# Patient Record
Sex: Male | Born: 1980 | Race: Black or African American | Hispanic: No | Marital: Single | Smoking: Current every day smoker
Health system: Southern US, Community
[De-identification: ages and names within clinical notes are randomized; demographics above are authoritative.]

## PROBLEM LIST (undated history)

## (undated) DIAGNOSIS — J45909 Unspecified asthma, uncomplicated: Secondary | ICD-10-CM

## (undated) HISTORY — PX: SHOULDER ARTHROSCOPY: SHX128

---

## 2012-07-10 HISTORY — PX: SHOULDER SURGERY: SHX246

## 2013-04-11 ENCOUNTER — Emergency Department (HOSPITAL_COMMUNITY)
Admission: EM | Admit: 2013-04-11 | Discharge: 2013-04-12 | Disposition: A | Discharge status: Home / Self Care | Payer: Self-pay | Attending: Emergency Medicine | Admitting: Emergency Medicine

## 2013-04-11 ENCOUNTER — Encounter (HOSPITAL_COMMUNITY): Payer: Self-pay | Admitting: Emergency Medicine

## 2013-04-11 DIAGNOSIS — R1110 Vomiting, unspecified: Secondary | ICD-10-CM | POA: Insufficient documentation

## 2013-04-11 DIAGNOSIS — H53149 Visual discomfort, unspecified: Secondary | ICD-10-CM | POA: Insufficient documentation

## 2013-04-11 DIAGNOSIS — H538 Other visual disturbances: Secondary | ICD-10-CM | POA: Insufficient documentation

## 2013-04-11 DIAGNOSIS — Z79899 Other long term (current) drug therapy: Secondary | ICD-10-CM | POA: Insufficient documentation

## 2013-04-11 DIAGNOSIS — R51 Headache: Secondary | ICD-10-CM | POA: Insufficient documentation

## 2013-04-11 DIAGNOSIS — J45909 Unspecified asthma, uncomplicated: Secondary | ICD-10-CM | POA: Insufficient documentation

## 2013-04-11 DIAGNOSIS — F17200 Nicotine dependence, unspecified, uncomplicated: Secondary | ICD-10-CM | POA: Insufficient documentation

## 2013-04-11 HISTORY — DX: Unspecified asthma, uncomplicated: J45.909

## 2013-04-11 MED ORDER — DIPHENHYDRAMINE HCL 50 MG/ML IJ SOLN
25.0000 mg | Freq: Once | INTRAMUSCULAR | Status: AC
  Administered 2013-04-12: 25 mg via INTRAMUSCULAR
  Filled 2013-04-11 (×2): qty 1

## 2013-04-11 MED ORDER — METOCLOPRAMIDE HCL 5 MG/ML IJ SOLN
10.0000 mg | Freq: Once | INTRAMUSCULAR | Status: AC
  Administered 2013-04-12: 10 mg via INTRAMUSCULAR
  Filled 2013-04-11 (×2): qty 2

## 2013-04-11 NOTE — ED Notes (Signed)
PT. REPORTS RIGHT SIDE HEADACHE FOR 2 WEEKS AND VOMITTED YESTERDAY .

## 2013-04-11 NOTE — ED Provider Notes (Signed)
History    This chart was scribed for non-physician practitioner working with Joya Gaskins, MD by Donne Anon, ED Scribe. This patient was seen in room TR10C/TR10C and the patient's care was started at 2327.   CSN: [id number]  Arrival date & time 04/11/13  2150   First MD Initiated Contact with Patient 04/11/13 2327      Chief Complaint  Patient presents with  . Headache     The history is provided by the patient. No language interpreter was used.   Travis Soto is a 32 y.o. male who presents to the Emergency Department complaining of gradual onset, moderate, intermittent, gradually worsening right sided HA which is localized in the back of his head and began 2 weeks ago with episodes lasting several hours. He reports photophobia and sensitivity to loud sounds. He vomited yesterday. He states the HA are worse in afternoon once he wakes up (works overnight). He reports associated blurred vision and mild balance change (today only). He has tried Tylenol with mild relief. He states he has never had a HA like this before. He denies any recent head trauma.  He does not have a PCP.  Past Medical History  Diagnosis Date  . Asthma     History reviewed. No pertinent past surgical history.  No family history on file.  History  Substance Use Topics  . Smoking status: Current Every Day Smoker  . Smokeless tobacco: Not on file  . Alcohol Use: No      Review of Systems  Constitutional: Negative for fever.  HENT: Negative for congestion, rhinorrhea, neck pain, neck stiffness, dental problem and sinus pressure.   Eyes: Positive for photophobia and visual disturbance. Negative for discharge and redness.  Respiratory: Negative for shortness of breath.   Cardiovascular: Negative for chest pain.  Gastrointestinal: Positive for vomiting. Negative for nausea.  Musculoskeletal: Negative for gait problem.  Skin: Negative for rash.  Neurological: Positive for headaches. Negative for  syncope, speech difficulty, weakness, light-headedness and numbness.  Psychiatric/Behavioral: Negative for confusion.    Allergies  Coffee flavor; Mushroom extract complex; Tomato; and Food  Home Medications   Current Outpatient Rx  Name  Route  Sig  Dispense  Refill  . acetaminophen (TYLENOL) 500 MG tablet   Oral   Take 1,000 mg by mouth 3 (three) times daily as needed for pain.         Marland Kitchen albuterol (PROVENTIL HFA;VENTOLIN HFA) 108 (90 BASE) MCG/ACT inhaler   Inhalation   Inhale 2 puffs into the lungs 2 (two) times daily as needed for wheezing or shortness of breath.           Triage Vitals; BP 116/85  Pulse 66  Temp(Src) 98.9 F (37.2 C) (Oral)  Resp 14  SpO2 98%  Physical Exam  Nursing note and vitals reviewed. Constitutional: He is oriented to person, place, and time. He appears well-developed and well-nourished. No distress.  HENT:  Head: Normocephalic and atraumatic.  Right Ear: Tympanic membrane, external ear and ear canal normal.  Left Ear: Tympanic membrane, external ear and ear canal normal.  Nose: Nose normal.  Mouth/Throat: Uvula is midline, oropharynx is clear and moist and mucous membranes are normal.  Eyes: Conjunctivae, EOM and lids are normal. Pupils are equal, round, and reactive to light.  Neck: Normal range of motion. Neck supple. No tracheal deviation present.  No meningismus.   Cardiovascular: Normal rate and regular rhythm.   Pulmonary/Chest: Effort normal and breath sounds normal. No respiratory distress.  Abdominal: Soft. There is no tenderness.  Musculoskeletal: Normal range of motion.       Cervical back: He exhibits normal range of motion, no tenderness and no bony tenderness.  Neurological: He is alert and oriented to person, place, and time. He has normal strength and normal reflexes. No cranial nerve deficit or sensory deficit. He exhibits normal muscle tone. He displays a negative Romberg sign. Coordination and gait normal. GCS eye  subscore is 4. GCS verbal subscore is 5. GCS motor subscore is 6.  Skin: Skin is warm and dry.  Psychiatric: He has a normal mood and affect. His behavior is normal.    ED Course  Procedures (including critical care time) DIAGNOSTIC STUDIES: Oxygen Saturation is 98% on room air, normal by my interpretation.    COORDINATION OF CARE: 11:49 PM Discussed treatment plan which includes head CT and medication with pt at bedside and pt agreed to plan.     Labs Reviewed - No data to display Ct Head Wo Contrast  04/12/2013  *RADIOLOGY REPORT*  Clinical Data: Right-sided headache  CT HEAD WITHOUT CONTRAST  Technique:  Contiguous axial images were obtained from the base of the skull through the vertex without contrast.  Comparison: None.  Findings: There is no evidence for acute hemorrhage, hydrocephalus, mass lesion, or abnormal extra-axial fluid collection.  No definite CT evidence for acute infarction.  The visualized paranasal sinuses and mastoid air cells are predominately clear.  IMPRESSION: No acute intracranial abnormality.   Original Report Authenticated By: Jearld Lesch, M.D.      1. Headache     12:14 AM Patient seen and examined. Medications ordered. CT given atypical HA, lack of follow-up.   Vital signs reviewed and are as follows: Filed Vitals:   04/11/13 2159  BP: 116/85  Pulse: 66  Temp: 98.9 F (37.2 C)  Resp: 14   CT negative. Pt informed. Pain 6/10. He initially refused benadryl/reglan because he thought this was for nausea only. After explaining it was a treatment for HA pain. He consented and was given this medication prior to d/c. He has a ride home.   Urged PCP/neurology f/u. Patient urged to return with worsening symptoms or other concerns. Patient verbalized understanding and agrees with plan.     MDM  Pt with HA. CT performed as patient does not typically have HA, has new type of HA he has never had before, has HA that is everyday, has no reliable  follow-up. CT does not show any specific cause including mass or bleeding. This does not sound like recurrent sentinel bleeding and I feel SAH is very low. PCP/neurology f/u given with return if worsening.  Patient appears well, non-toxic. He has normal neurological exam.    I personally performed the services described in this documentation, which was scribed in my presence. The recorded information has been reviewed and is accurate.         Renne Crigler, PA-C 04/13/13 1321

## 2013-04-12 ENCOUNTER — Emergency Department (HOSPITAL_COMMUNITY): Payer: Self-pay

## 2013-04-12 ENCOUNTER — Encounter (HOSPITAL_COMMUNITY): Payer: Self-pay | Admitting: Radiology

## 2013-04-12 MED ORDER — IBUPROFEN 400 MG PO TABS
600.0000 mg | ORAL_TABLET | Freq: Once | ORAL | Status: AC
  Administered 2013-04-12: 600 mg via ORAL
  Filled 2013-04-12: qty 1

## 2013-04-12 NOTE — ED Notes (Signed)
Pt discharged.Vital signs stable and GCS 15 

## 2013-04-14 NOTE — ED Provider Notes (Signed)
Medical screening examination/treatment/procedure(s) were performed by non-physician practitioner and as supervising physician I was immediately available for consultation/collaboration.   Joya Gaskins, MD 04/14/13 [phone]

## 2013-05-18 ENCOUNTER — Encounter (HOSPITAL_COMMUNITY): Payer: Self-pay | Admitting: Emergency Medicine

## 2013-05-18 DIAGNOSIS — F17200 Nicotine dependence, unspecified, uncomplicated: Secondary | ICD-10-CM | POA: Insufficient documentation

## 2013-05-18 DIAGNOSIS — Z79899 Other long term (current) drug therapy: Secondary | ICD-10-CM | POA: Insufficient documentation

## 2013-05-18 DIAGNOSIS — J45909 Unspecified asthma, uncomplicated: Secondary | ICD-10-CM | POA: Insufficient documentation

## 2013-05-18 DIAGNOSIS — M7500 Adhesive capsulitis of unspecified shoulder: Secondary | ICD-10-CM | POA: Insufficient documentation

## 2013-05-18 DIAGNOSIS — M25519 Pain in unspecified shoulder: Secondary | ICD-10-CM | POA: Insufficient documentation
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
## 2013-05-19 ENCOUNTER — Encounter (HOSPITAL_COMMUNITY): Payer: Self-pay

## 2013-05-19 ENCOUNTER — Emergency Department (HOSPITAL_COMMUNITY): Payer: Self-pay

## 2013-05-19 ENCOUNTER — Emergency Department (HOSPITAL_COMMUNITY)
Admission: EM | Admit: 2013-05-19 | Discharge: 2013-05-19 | Disposition: A | Discharge status: Home / Self Care | Payer: Self-pay | Attending: Emergency Medicine | Admitting: Emergency Medicine

## 2013-05-19 DIAGNOSIS — M7502 Adhesive capsulitis of left shoulder: Secondary | ICD-10-CM

## 2013-05-19 DIAGNOSIS — M25512 Pain in left shoulder: Secondary | ICD-10-CM

## 2013-05-19 MED ORDER — CYCLOBENZAPRINE HCL 10 MG PO TABS: 5.0000 mg | ORAL_TABLET | Freq: Three times a day (TID) | ORAL | Status: DC | PRN

## 2013-05-19 MED ORDER — HYDROCODONE-ACETAMINOPHEN 5-325 MG PO TABS: 1.0000 | ORAL_TABLET | Freq: Four times a day (QID) | ORAL | Status: DC | PRN

## 2013-05-19 MED ORDER — HYDROCODONE-ACETAMINOPHEN 5-325 MG PO TABS
1.0000 | ORAL_TABLET | Freq: Once | ORAL | Status: AC
  Administered 2013-05-19: 1 via ORAL
  Filled 2013-05-19: qty 1

## 2013-05-19 MED ORDER — IBUPROFEN 800 MG PO TABS
800.0000 mg | ORAL_TABLET | Freq: Once | ORAL | Status: AC
  Administered 2013-05-19: 800 mg via ORAL
  Filled 2013-05-19: qty 1

## 2013-05-19 NOTE — ED Notes (Signed)
Pt back from xray and resting

## 2013-05-19 NOTE — ED Provider Notes (Deleted)
History     CSN: [id number]  Arrival date & time 05/18/13  2226   First MD Initiated Contact with Patient 05/19/13 0215      Chief Complaint  Patient presents with  . Shoulder Pain    (Consider location/radiation/quality/duration/timing/severity/associated sxs/prior treatment) Patient is a 32 y.o. male presenting with shoulder pain.  Shoulder Pain    Past Medical History  Diagnosis Date  . Asthma     Past Surgical History  Procedure Laterality Date  . Shoulder surgery Left 07/10/2012    History reviewed. No pertinent family history.  History  Substance Use Topics  . Smoking status: Current Every Day Smoker    Types: Cigarettes  . Smokeless tobacco: Not on file  . Alcohol Use: No      Review of Systems  Allergies  Coffee flavor; Mushroom extract complex; Tomato; and Food  Home Medications   Current Outpatient Rx  Name  Route  Sig  Dispense  Refill  . acetaminophen (TYLENOL) 500 MG tablet   Oral   Take 1,000 mg by mouth 3 (three) times daily as needed for pain.         Marland Kitchen albuterol (PROVENTIL HFA;VENTOLIN HFA) 108 (90 BASE) MCG/ACT inhaler   Inhalation   Inhale 2 puffs into the lungs 2 (two) times daily as needed for wheezing or shortness of breath.         . cyclobenzaprine (FLEXERIL) 10 MG tablet   Oral   Take 0.5 tablets (5 mg total) by mouth 3 (three) times daily as needed for muscle spasms.   30 tablet   0   . HYDROcodone-acetaminophen (NORCO/VICODIN) 5-325 MG per tablet   Oral   Take 1 tablet by mouth every 6 (six) hours as needed for pain.   30 tablet   0     BP 107/71  Pulse 60  Temp(Src) 97.7 F (36.5 C) (Oral)  Resp 18  SpO2 98%  Physical Exam  ED Course  Procedures (including critical care time)  Labs Reviewed - No data to display Dg Shoulder Left  05/19/2013   *RADIOLOGY REPORT*  Clinical Data: Left shoulder pain and numbness  LEFT SHOULDER - 2+ VIEW  Comparison: None.  Findings: Glenohumeral and acromioclavicular  joints are intact.  No displaced fracture. No aggressive osseous lesions.  Left lung clear.  IMPRESSION: No acute osseous finding left shoulder.   Original Report Authenticated By: Jearld Lesch, M.D.     1. Shoulder pain, left   2. Adhesive capsulitis of left shoulder       MDM   Plan was reviewed with the patient.  There is no fracture, and he, has adhesive capsulitis of his left shoulder.  Due to previous surgery        Arman Filter, NP 05/19/13 [phone]

## 2013-05-19 NOTE — ED Notes (Signed)
Patient transported to X-ray 

## 2013-05-19 NOTE — ED Provider Notes (Signed)
Medical screening examination/treatment/procedure(s) were performed by non-physician practitioner and as supervising physician I was immediately available for consultation/collaboration.  Sunnie Nielsen, MD 05/19/13 [phone]
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
## 2013-06-05 NOTE — ED Provider Notes (Signed)
History     CSN: [id number]  Arrival date & time 05/18/13  2226   First MD Initiated Contact with Patient 05/19/13 0215      Chief Complaint  Patient presents with  . Shoulder Pain    (Consider location/radiation/quality/duration/timing/severity/associated sxs/prior treatment) HPI Comments: Recurrent L shoulder pain for the past week No meds PTA  Patient is a 32 y.o. male presenting with shoulder pain. The history is provided by the patient.  Shoulder Pain This is a recurrent problem. The current episode started in the past 7 days. The problem has been gradually worsening. Associated symptoms include joint swelling. Pertinent negatives include no numbness or weakness. The symptoms are aggravated by exertion. He has tried nothing for the symptoms. The treatment provided no relief.    Past Medical History  Diagnosis Date  . Asthma     Past Surgical History  Procedure Laterality Date  . Shoulder surgery Left 07/10/2012    History reviewed. No pertinent family history.  History  Substance Use Topics  . Smoking status: Current Every Day Smoker    Types: Cigarettes  . Smokeless tobacco: Not on file  . Alcohol Use: No      Review of Systems  Unable to perform ROS Musculoskeletal: Positive for joint swelling.  Neurological: Negative for weakness and numbness.  All other systems reviewed and are negative.    Allergies  Coffee flavor; Mushroom extract complex; Tomato; and Food  Home Medications   Current Outpatient Rx  Name  Route  Sig  Dispense  Refill  . acetaminophen (TYLENOL) 500 MG tablet   Oral   Take 1,000 mg by mouth 3 (three) times daily as needed for pain.         Marland Kitchen albuterol (PROVENTIL HFA;VENTOLIN HFA) 108 (90 BASE) MCG/ACT inhaler   Inhalation   Inhale 2 puffs into the lungs 2 (two) times daily as needed for wheezing or shortness of breath.         . cyclobenzaprine (FLEXERIL) 10 MG tablet   Oral   Take 0.5 tablets (5 mg total) by mouth 3  (three) times daily as needed for muscle spasms.   30 tablet   0   . HYDROcodone-acetaminophen (NORCO/VICODIN) 5-325 MG per tablet   Oral   Take 1 tablet by mouth every 6 (six) hours as needed for pain.   30 tablet   0     BP 107/71  Pulse 60  Temp(Src) 97.7 F (36.5 C) (Oral)  Resp 18  SpO2 98%  Physical Exam  Nursing note and vitals reviewed. Constitutional: He is oriented to person, place, and time. He appears well-developed and well-nourished.  HENT:  Head: Normocephalic.  Eyes: Pupils are equal, round, and reactive to light.  Neck: Normal range of motion.  Cardiovascular: Normal rate and regular rhythm.   Pulmonary/Chest: Effort normal and breath sounds normal.  Musculoskeletal: He exhibits tenderness. He exhibits no edema.       Left shoulder: He exhibits decreased range of motion and pain. He exhibits no swelling, no effusion, no crepitus, no deformity and no laceration.  Neurological: He is alert and oriented to person, place, and time.  Skin: Skin is warm and dry.    ED Course  Procedures (including critical care time)  Labs Reviewed - No data to display No results found.   1. Shoulder pain, left   2. Adhesive capsulitis of left shoulder       MDM  Arman Filter, NP 06/05/13 2050

## 2013-06-06 NOTE — ED Provider Notes (Signed)
Medical screening examination/treatment/procedure(s) were performed by non-physician practitioner and as supervising physician I was immediately available for consultation/collaboration.  Juliet Rude. Rubin Payor, MD 06/06/13 2308

## 2013-07-31 ENCOUNTER — Emergency Department (HOSPITAL_COMMUNITY): Payer: Self-pay

## 2013-07-31 ENCOUNTER — Emergency Department (HOSPITAL_COMMUNITY)
Admission: EM | Admit: 2013-07-31 | Discharge: 2013-08-01 | Disposition: A | Discharge status: Home / Self Care | Payer: Self-pay | Attending: Emergency Medicine | Admitting: Emergency Medicine

## 2013-07-31 ENCOUNTER — Encounter (HOSPITAL_COMMUNITY): Payer: Self-pay | Admitting: *Deleted

## 2013-07-31 DIAGNOSIS — J45909 Unspecified asthma, uncomplicated: Secondary | ICD-10-CM | POA: Insufficient documentation

## 2013-07-31 DIAGNOSIS — F17200 Nicotine dependence, unspecified, uncomplicated: Secondary | ICD-10-CM | POA: Insufficient documentation

## 2013-07-31 DIAGNOSIS — R071 Chest pain on breathing: Secondary | ICD-10-CM | POA: Insufficient documentation

## 2013-07-31 DIAGNOSIS — R0789 Other chest pain: Secondary | ICD-10-CM

## 2013-07-31 DIAGNOSIS — Z79899 Other long term (current) drug therapy: Secondary | ICD-10-CM | POA: Insufficient documentation

## 2013-07-31 MED ORDER — IBUPROFEN 800 MG PO TABS
800.0000 mg | ORAL_TABLET | Freq: Once | ORAL | Status: AC
  Administered 2013-07-31: 800 mg via ORAL
  Filled 2013-07-31: qty 1

## 2013-07-31 MED ORDER — GI COCKTAIL ~~LOC~~
30.0000 mL | Freq: Once | ORAL | Status: AC
  Administered 2013-07-31: 30 mL via ORAL
  Filled 2013-07-31: qty 30

## 2013-07-31 MED ORDER — HYDROCODONE-ACETAMINOPHEN 5-325 MG PO TABS
2.0000 | ORAL_TABLET | Freq: Once | ORAL | Status: AC
  Administered 2013-07-31: 2 via ORAL
  Filled 2013-07-31: qty 2

## 2013-07-31 NOTE — ED Notes (Signed)
NURSE FIRST ROUNDS: NURSE EXPLAINED DELAY , WAIT TIME AND PROCESS TO PT.

## 2013-07-31 NOTE — ED Notes (Signed)
The pt is c/o mid-chest pain since Saturday.  No sob n v and his pain is worse when he swallows.  The pain radiates through posteriorly

## 2013-08-01 MED ORDER — HYDROCODONE-ACETAMINOPHEN 5-325 MG PO TABS: 2.0000 | ORAL_TABLET | Freq: Four times a day (QID) | ORAL | Status: DC | PRN

## 2013-08-01 MED ORDER — IBUPROFEN 800 MG PO TABS: 800.0000 mg | ORAL_TABLET | Freq: Three times a day (TID) | ORAL | Status: DC | PRN

## 2013-08-01 NOTE — ED Provider Notes (Signed)
CSN: [id number]     Arrival date & time 07/31/13  1759 History     First MD Initiated Contact with Patient 07/31/13 2259     Chief Complaint  Patient presents with  . Chest Pain   (Consider location/radiation/quality/duration/timing/severity/associated sxs/prior Treatment) HPI 32 yo male with chest pain for the last 5 days that has not been improving.  Pain is in the center of the chest and in the center of the back.  No sob, no n/v.  No abd pain.  Pain is worse with movement and palpation. Pt also reports some pain with swallowing.  No sensation of food getting hung up, no reflux symptoms.  Pt reports he was moving heavy boxes at the end of last week, but did not have pain at the time.  He has not taken anything for the pain.  No significant family history.   Past Medical History  Diagnosis Date  . Asthma    Past Surgical History  Procedure Laterality Date  . Shoulder surgery Left 07/10/2012   No family history on file. History  Substance Use Topics  . Smoking status: Current Every Day Smoker    Types: Cigarettes  . Smokeless tobacco: Not on file  . Alcohol Use: No    Review of Systems  All other systems reviewed and are negative.    Allergies  Coffee flavor; Mushroom extract complex; Tomato; and Food  Home Medications   Current Outpatient Rx  Name  Route  Sig  Dispense  Refill  . acetaminophen (TYLENOL) 500 MG tablet   Oral   Take 1,000 mg by mouth 3 (three) times daily as needed for pain.         . Chlorphen-Phenyleph-ASA (ALKA-SELTZER PLUS COLD PO)   Oral   Take 2 tablets by mouth daily as needed (cold).         . cyclobenzaprine (FLEXERIL) 10 MG tablet   Oral   Take 0.5 tablets (5 mg total) by mouth 3 (three) times daily as needed for muscle spasms.   30 tablet   0   . OVER THE COUNTER MEDICATION   Oral   Take 1 tablet by mouth daily as needed (congestion).         Marland Kitchen albuterol (PROVENTIL HFA;VENTOLIN HFA) 108 (90 BASE) MCG/ACT inhaler  Inhalation   Inhale 2 puffs into the lungs 2 (two) times daily as needed for wheezing or shortness of breath.         Marland Kitchen HYDROcodone-acetaminophen (NORCO/VICODIN) 5-325 MG per tablet   Oral   Take 2 tablets by mouth every 6 (six) hours as needed for pain.   30 tablet   0   . ibuprofen (ADVIL,MOTRIN) 800 MG tablet   Oral   Take 1 tablet (800 mg total) by mouth every 8 (eight) hours as needed for pain.   30 tablet   0    BP 124/75  Pulse 57  Temp(Src) 98.7 F (37.1 C) (Oral)  Resp 14  SpO2 100% Physical Exam  Nursing note and vitals reviewed. Constitutional: He is oriented to person, place, and time. He appears well-developed and well-nourished.  HENT:  Head: Normocephalic and atraumatic.  Nose: Nose normal.  Mouth/Throat: Oropharynx is clear and moist.  Eyes: Conjunctivae and EOM are normal. Pupils are equal, round, and reactive to light.  Neck: Normal range of motion. Neck supple. No JVD present. No tracheal deviation present. No thyromegaly present.  Cardiovascular: Normal rate, regular rhythm, normal heart sounds and intact distal pulses.  Exam reveals no gallop and no friction rub.   No murmur heard. Pulmonary/Chest: Effort normal and breath sounds normal. No stridor. No respiratory distress. He has no wheezes. He has no rales. He exhibits tenderness (ttp over sternum and over posterior chest wall).  Palpation of chest wall reproduces symptoms  Abdominal: Soft. Bowel sounds are normal. He exhibits no distension and no mass. There is no tenderness. There is no rebound and no guarding.  Musculoskeletal: Normal range of motion. He exhibits no edema and no tenderness.  Lymphadenopathy:    He has no cervical adenopathy.  Neurological: He is alert and oriented to person, place, and time. He exhibits normal muscle tone. Coordination normal.  Skin: Skin is warm and dry. No rash noted. No erythema. No pallor.  Psychiatric: He has a normal mood and affect. His behavior is normal.  Judgment and thought content normal.    ED Course   Procedures (including critical care time)  Labs Reviewed - No data to display Dg Chest 2 View  07/31/2013   *RADIOLOGY REPORT*  Clinical Data: Chest pain, smoker  CHEST - 2 VIEW  Comparison:  None.  Findings:  The heart size and mediastinal contours are within normal limits.  Both lungs are clear.  The visualized skeletal structures are unremarkable.  IMPRESSION: No active cardiopulmonary disease.   Original Report Authenticated By: Christiana Pellant, M.D.    Date: 08/01/2013  Rate: 71  Rhythm: normal sinus rhythm  QRS Axis: normal  Intervals: normal  ST/T Wave abnormalities: nonspecific ST/T changes  Conduction Disutrbances:none  Narrative Interpretation:   Old EKG Reviewed: none available    1. Chest wall pain     MDM  31 yo male with anterior and posterior chest pain.  Xray normal.  Sxs constant since Saturday, do not seem cardiac in nature, doubt dissection/aneurysm of aorta.  Reproducible pain on exam.  The dysphagia does not fit in with rest of picture, but no choking or gagging.  Pt feeling better after medications.  Will refer to pcm, continue pain medications.  Given precautions for return.  Olivia Mackie, MD 08/01/13 [phone]

## 2013-09-20 ENCOUNTER — Emergency Department (HOSPITAL_COMMUNITY)
Admission: EM | Admit: 2013-09-20 | Discharge: 2013-09-20 | Disposition: A | Discharge status: Home / Self Care | Payer: Self-pay | Attending: Emergency Medicine | Admitting: Emergency Medicine

## 2013-09-20 ENCOUNTER — Encounter (HOSPITAL_COMMUNITY): Payer: Self-pay | Admitting: *Deleted

## 2013-09-20 ENCOUNTER — Emergency Department (HOSPITAL_COMMUNITY): Payer: Self-pay

## 2013-09-20 DIAGNOSIS — Z87891 Personal history of nicotine dependence: Secondary | ICD-10-CM | POA: Insufficient documentation

## 2013-09-20 DIAGNOSIS — W108XXA Fall (on) (from) other stairs and steps, initial encounter: Secondary | ICD-10-CM | POA: Insufficient documentation

## 2013-09-20 DIAGNOSIS — Y929 Unspecified place or not applicable: Secondary | ICD-10-CM | POA: Insufficient documentation

## 2013-09-20 DIAGNOSIS — R0789 Other chest pain: Secondary | ICD-10-CM

## 2013-09-20 DIAGNOSIS — S298XXA Other specified injuries of thorax, initial encounter: Secondary | ICD-10-CM | POA: Insufficient documentation

## 2013-09-20 DIAGNOSIS — Y9389 Activity, other specified: Secondary | ICD-10-CM | POA: Insufficient documentation

## 2013-09-20 DIAGNOSIS — Z79899 Other long term (current) drug therapy: Secondary | ICD-10-CM | POA: Insufficient documentation

## 2013-09-20 DIAGNOSIS — J45909 Unspecified asthma, uncomplicated: Secondary | ICD-10-CM | POA: Insufficient documentation

## 2013-09-20 MED ORDER — IBUPROFEN 800 MG PO TABS
800.0000 mg | ORAL_TABLET | Freq: Once | ORAL | Status: AC
  Administered 2013-09-20: 800 mg via ORAL
  Filled 2013-09-20: qty 1

## 2013-09-20 MED ORDER — OXYCODONE-ACETAMINOPHEN 5-325 MG PO TABS: 1.0000 | ORAL_TABLET | Freq: Four times a day (QID) | ORAL | Status: DC | PRN

## 2013-09-20 MED ORDER — IBUPROFEN 800 MG PO TABS: 800.0000 mg | ORAL_TABLET | Freq: Three times a day (TID) | ORAL | Status: DC

## 2013-09-20 NOTE — ED Notes (Addendum)
Pt. Fell down the steps while carrying a TV; fell backwards and hurt his left side. Pt. Has abrasions across the right upper ant. Chest. No open wounds. Left arm hurts -  Difficult to lift arm.  Hx. Of left shoulder surgery.

## 2013-09-20 NOTE — ED Provider Notes (Signed)
Medical screening examination/treatment/procedure(s) were performed by non-physician practitioner and as supervising physician I was immediately available for consultation/collaboration.  Olivia Mackie, MD 09/20/13 [phone]

## 2013-09-20 NOTE — ED Provider Notes (Signed)
CSN: [id number]     Arrival date & time 09/20/13  0303 History   First MD Initiated Contact with Patient 09/20/13 0309     Chief Complaint  Patient presents with  . Fall   (Consider location/radiation/quality/duration/timing/severity/associated sxs/prior Treatment) HPI Comments: Patient is a 32 year old male who presents for chest wall pain. Pain began after patient was carrying a TV and fell backwards down the stairs causing a TV to fall on top of him. Patient states that symptoms have been constant since onset a few hours ago. He states the pain is worse with deep breathing. He denies any alleviating factors of his pain and has not taken anything over-the-counter prior to arrival. Patient denies hitting his head or loss of consciousness. He further denies fevers, near syncope, numbness or tingling, and weakness.  Patient is a 32 y.o. male presenting with fall. The history is provided by the patient. No language interpreter was used.  Fall Associated symptoms include chest pain and myalgias.    Past Medical History  Diagnosis Date  . Asthma    Past Surgical History  Procedure Laterality Date  . Shoulder surgery Left 07/10/2012   History reviewed. No pertinent family history. History  Substance Use Topics  . Smoking status: Former Smoker    Types: Cigarettes  . Smokeless tobacco: Not on file  . Alcohol Use: No    Review of Systems  Cardiovascular: Positive for chest pain.  Musculoskeletal: Positive for myalgias.  All other systems reviewed and are negative.    Allergies  Coffee flavor; Mushroom extract complex; Tomato; and Food  Home Medications   Current Outpatient Rx  Name  Route  Sig  Dispense  Refill  . albuterol (PROVENTIL HFA;VENTOLIN HFA) 108 (90 BASE) MCG/ACT inhaler   Inhalation   Inhale 2 puffs into the lungs every 6 (six) hours as needed for wheezing.         . cyclobenzaprine (FLEXERIL) 10 MG tablet   Oral   Take 0.5 tablets (5 mg total) by mouth 3  (three) times daily as needed for muscle spasms.   30 tablet   0   . ibuprofen (ADVIL,MOTRIN) 800 MG tablet   Oral   Take 1 tablet (800 mg total) by mouth 3 (three) times daily.   21 tablet   0   . oxyCODONE-acetaminophen (PERCOCET/ROXICET) 5-325 MG per tablet   Oral   Take 1 tablet by mouth every 6 (six) hours as needed for pain.   11 tablet   0    BP 146/84  Pulse 78  Temp(Src) 98.5 F (36.9 C) (Oral)  Resp 10  Ht 6' (1.829 m)  Wt 187 lb (84.823 kg)  BMI 25.36 kg/m2  SpO2 98% Physical Exam  Nursing note and vitals reviewed. Constitutional: He is oriented to person, place, and time. He appears well-developed and well-nourished. No distress.  HENT:  Head: Normocephalic and atraumatic.  Eyes: Conjunctivae and EOM are normal. No scleral icterus.  Neck: Normal range of motion. Neck supple.  Cardiovascular: Normal rate, regular rhythm, normal heart sounds and intact distal pulses.   Distal radial pulses 2+ bilaterally.  Pulmonary/Chest: Effort normal and breath sounds normal. No respiratory distress. He has no wheezes. He has no rales. He exhibits tenderness. He exhibits no bony tenderness, no crepitus, no deformity and no retraction.    Abrasion noted to right anterior chest wall. No respiratory distress. No retractions or accessory muscle use.  Abdominal: Soft. He exhibits no distension. There is no tenderness.  Musculoskeletal:  Normal range of motion.  No evidence of acute trauma to the back; no contusions, abrasions, lacerations, or ecchymosis. No tenderness to palpation of the cervical, thoracic, or lumbosacral midline. No bony deformities or step-offs palpated.  Neurological: He is alert and oriented to person, place, and time.  Skin: Skin is warm and dry. No rash noted. He is not diaphoretic. No erythema. No pallor.  Psychiatric: He has a normal mood and affect. His behavior is normal.    ED Course  Procedures (including critical care time) Labs Review Labs  Reviewed - No data to display Imaging Review Dg Chest 2 View  09/20/2013   CLINICAL DATA:  Fall, chest pain, shortness of Breath.  EXAM: CHEST  2 VIEW  COMPARISON:  07/31/2013  FINDINGS: The heart size and mediastinal contours are within normal limits. Both lungs are clear. The visualized skeletal structures are unremarkable. No visible rib fractures. No pneumothorax.  IMPRESSION: No active cardiopulmonary disease.   Electronically Signed   By: Charlett Nose M.D.   On: 09/20/2013 03:40    MDM   1. Chest wall pain    Patient presents for chest pain after falling while carrying a TV. Patient well and nontoxic appearing, hemodynamically stable, and afebrile. Patient moves his extremities without ataxia. He is in no respiratory distress with no accessory muscle use or retractions. Abrasions noted to right anterior chest. No other evidence of acute trauma appreciated. Chest x-ray without evidence of rib fractures by my interpretation. There is no pneumothorax. Lungs clear to auscultation bilaterally. Symptoms consistent with chest wall pain that is musculoskeletal in origin. Patient appropriate for discharge with primary care follow up. Ibuprofen and ice to the affected area denies her symptoms. Percocet prescribed for pain control. Return precautions discussed and patient agreeable to plan with no unaddressed concerns.    Antony Madura, PA-C 09/20/13 0422

## 2014-03-12 ENCOUNTER — Encounter (HOSPITAL_COMMUNITY): Payer: Self-pay | Admitting: Emergency Medicine

## 2014-03-12 DIAGNOSIS — L0231 Cutaneous abscess of buttock: Secondary | ICD-10-CM | POA: Insufficient documentation

## 2014-03-12 DIAGNOSIS — K047 Periapical abscess without sinus: Secondary | ICD-10-CM | POA: Insufficient documentation

## 2014-03-12 DIAGNOSIS — Z87891 Personal history of nicotine dependence: Secondary | ICD-10-CM | POA: Insufficient documentation

## 2014-03-12 DIAGNOSIS — J45909 Unspecified asthma, uncomplicated: Secondary | ICD-10-CM | POA: Insufficient documentation

## 2014-03-12 DIAGNOSIS — R1909 Other intra-abdominal and pelvic swelling, mass and lump: Secondary | ICD-10-CM | POA: Insufficient documentation

## 2014-03-12 DIAGNOSIS — L03317 Cellulitis of buttock: Secondary | ICD-10-CM

## 2014-03-12 NOTE — ED Notes (Signed)
Patient with left jaw/dental pain and swelling.  Patient states that he also has boils in his right inguinal area and between his buttock cheeks.  Patient states he is having a hard time sitting due to the pain.

## 2014-03-13 ENCOUNTER — Emergency Department (HOSPITAL_COMMUNITY)
Admission: EM | Admit: 2014-03-13 | Discharge: 2014-03-13 | Disposition: A | Discharge status: Home / Self Care | Payer: Self-pay | Attending: Emergency Medicine | Admitting: Emergency Medicine

## 2014-03-13 DIAGNOSIS — K047 Periapical abscess without sinus: Secondary | ICD-10-CM

## 2014-03-13 DIAGNOSIS — L0291 Cutaneous abscess, unspecified: Secondary | ICD-10-CM

## 2014-03-13 MED ORDER — OXYCODONE-ACETAMINOPHEN 5-325 MG PO TABS
1.0000 | ORAL_TABLET | Freq: Once | ORAL | Status: AC
  Administered 2014-03-13: 1 via ORAL
  Filled 2014-03-13: qty 1

## 2014-03-13 MED ORDER — CLINDAMYCIN HCL 150 MG PO CAPS
300.0000 mg | ORAL_CAPSULE | Freq: Once | ORAL | Status: AC
  Administered 2014-03-13: 300 mg via ORAL
  Filled 2014-03-13: qty 2

## 2014-03-13 MED ORDER — CLINDAMYCIN HCL 150 MG PO CAPS: 300.0000 mg | ORAL_CAPSULE | Freq: Three times a day (TID) | ORAL | Status: DC

## 2014-03-13 MED ORDER — OXYCODONE-ACETAMINOPHEN 5-325 MG PO TABS: 1.0000 | ORAL_TABLET | ORAL | Status: DC | PRN

## 2014-03-13 NOTE — Discharge Instructions (Signed)
Abscess An abscess is an infected area that contains a collection of pus and debris.It can occur in almost any part of the body. An abscess is also known as a furuncle or boil. CAUSES  An abscess occurs when tissue gets infected. This can occur from blockage of oil or sweat glands, infection of hair follicles, or a minor injury to the skin. As the body tries to fight the infection, pus collects in the area and creates pressure under the skin. This pressure causes pain. People with weakened immune systems have difficulty fighting infections and get certain abscesses more often.  SYMPTOMS Usually an abscess develops on the skin and becomes a painful mass that is red, warm, and tender. If the abscess forms under the skin, you may feel a moveable soft area under the skin. Some abscesses break open (rupture) on their own, but most will continue to get worse without care. The infection can spread deeper into the body and eventually into the bloodstream, causing you to feel ill.  DIAGNOSIS  Your caregiver will take your medical history and perform a physical exam. A sample of fluid may also be taken from the abscess to determine what is causing your infection. TREATMENT  Your caregiver may prescribe antibiotic medicines to fight the infection. However, taking antibiotics alone usually does not cure an abscess. Your caregiver may need to make a small cut (incision) in the abscess to drain the pus. In some cases, gauze is packed into the abscess to reduce pain and to continue draining the area. HOME CARE INSTRUCTIONS   Only take over-the-counter or prescription medicines for pain, discomfort, or fever as directed by your caregiver.  If you were prescribed antibiotics, take them as directed. Finish them even if you start to feel better.  If gauze is used, follow your caregiver's directions for changing the gauze.  To avoid spreading the infection:  Keep your draining abscess covered with a  bandage.  Wash your hands well.  Do not share personal care items, towels, or whirlpools with others.  Avoid skin contact with others.  Keep your skin and clothes clean around the abscess.  Keep all follow-up appointments as directed by your caregiver. SEEK MEDICAL CARE IF:   You have increased pain, swelling, redness, fluid drainage, or bleeding.  You have muscle aches, chills, or a general ill feeling.  You have a fever. MAKE SURE YOU:   Understand these instructions.  Will watch your condition.  Will get help right away if you are not doing well or get worse. Document Released: 09/14/2005 Document Revised: 06/05/2012 Document Reviewed: 02/17/2012 [facility] Patient Information 2014 [place], [place].  Abscessed Tooth An abscessed tooth is an infection around your tooth. It may be caused by holes or damage to the tooth (cavity) or a dental disease. An abscessed tooth causes mild to very bad pain in and around the tooth. See your dentist right away if you have tooth or gum pain. HOME CARE  Take your medicine as told. Finish it even if you start to feel better.  Do not drive after taking pain medicine.  Rinse your mouth (gargle) often with salt water ( teaspoon salt in 8 ounces of warm water).  Do not apply heat to the outside of your face. GET HELP RIGHT AWAY IF:   You have a temperature by mouth above 102 F (38.9 C), not controlled by medicine.  You have chills and a very bad headache.  You have problems breathing or swallowing.  Your mouth  will not open.  You develop puffiness (swelling) on the neck or around the eye.  Your pain is not helped by medicine.  Your pain is getting worse instead of better. MAKE SURE YOU:   Understand these instructions.  Will watch your condition.  Will get help right away if you are not doing well or get worse. Document Released: 05/23/2008 Document Revised: 02/27/2012 Document Reviewed: 03/15/2011 [facility] Patient  Information 2014 [place], [place].

## 2014-03-13 NOTE — ED Notes (Signed)
[place]

## 2014-03-13 NOTE — ED Provider Notes (Signed)
CSN: [id number]     Arrival date & time 03/12/14  1914 History   First MD Initiated Contact with Patient 03/13/14 0025     Chief Complaint  Patient presents with  . Dental Pain  . Recurrent Skin Infections     (Consider location/radiation/quality/duration/timing/severity/associated sxs/prior Treatment) Patient is a 33 y.o. male presenting with tooth pain and abscess. The history is provided by the patient. No language interpreter was used.  Dental Pain Location:  Lower Lower teeth location:  28/RL 1st bicuspid Quality:  Aching Severity:  Moderate Duration:  2 days Associated symptoms: facial swelling and fever   Abscess Location:  Ano-genital and torso Torso abscess location: Suprapubic area.  Ano-genital abscess location:  L buttock Abscess quality comment:  Abscess to left buttock has opened and drained a significant amount of fluids. The suprapubic abscess has not been draining.  Associated symptoms: fever   Associated symptoms: no nausea     Past Medical History  Diagnosis Date  . Asthma    Past Surgical History  Procedure Laterality Date  . Shoulder surgery Left 07/10/2012   No family history on file. History  Substance Use Topics  . Smoking status: Former Smoker    Types: Cigarettes  . Smokeless tobacco: Not on file  . Alcohol Use: No    Review of Systems  Constitutional: Positive for fever.  HENT: Positive for dental problem and facial swelling.   Gastrointestinal: Negative for nausea.  Skin:       See HPI.      Allergies  Coffee flavor; Mushroom extract complex; Tomato; and Food  Home Medications   Current Outpatient Rx  Name  Route  Sig  Dispense  Refill  . acetaminophen (TYLENOL) 500 MG tablet   Oral   Take 1,000 mg by mouth every 6 (six) hours as needed.         Marland Kitchen. aspirin-acetaminophen-caffeine (EXCEDRIN MIGRAINE) 250-250-65 MG per tablet   Oral   Take 2 tablets by mouth every 8 (eight) hours as needed (for tooth pain).            BP 111/76  Pulse 68  Temp(Src) 98.5 F (36.9 C) (Oral)  Resp 14  SpO2 100% Physical Exam  Constitutional: He is oriented to person, place, and time. He appears well-developed and well-nourished.  HENT:  Small amount of drainage inferior to #28 without significant swelling.   Neck: Normal range of motion.  Pulmonary/Chest: Effort normal.  Musculoskeletal: Normal range of motion.  Neurological: He is alert and oriented to person, place, and time.  Skin: Skin is warm and dry.  Tender area at gluteal cleft at inferior left buttock. Drainage present without induration. No redness. No tenderness near the anus. Suprapubic region: small nodular tender swelling without drainage or fluctuance.   Psychiatric: He has a normal mood and affect.    ED Course  Procedures (including critical care time) Labs Review Labs Reviewed - No data to display Imaging Review No results found.   EKG Interpretation None      MDM   Final diagnoses:  None    1. Cutaneous abscesses 2. Dental abscess  No drainable abscess (buttock already opened, suprapublic early w/o fluctuance). Will put the patient on Clindamycin for coverage of both skin abscess and dental abscess. Recheck in 2 days if no better.     Arnoldo HookerShari A Rori Goar, PA-C 03/13/14 [phone]

## 2014-03-16 NOTE — ED Provider Notes (Signed)
Medical screening examination/treatment/procedure(s) were performed by non-physician practitioner and as supervising physician I was immediately available for consultation/collaboration.   EKG Interpretation None        Julie Manly, MD 03/16/14 0819 

## 2015-02-18 ENCOUNTER — Encounter (HOSPITAL_COMMUNITY): Payer: Self-pay | Admitting: Emergency Medicine

## 2015-02-18 ENCOUNTER — Emergency Department (HOSPITAL_COMMUNITY)
Admission: EM | Admit: 2015-02-18 | Discharge: 2015-02-18 | Disposition: A | Discharge status: Home / Self Care | Payer: Self-pay | Attending: Emergency Medicine | Admitting: Emergency Medicine

## 2015-02-18 DIAGNOSIS — Z792 Long term (current) use of antibiotics: Secondary | ICD-10-CM | POA: Insufficient documentation

## 2015-02-18 DIAGNOSIS — L0231 Cutaneous abscess of buttock: Secondary | ICD-10-CM | POA: Insufficient documentation

## 2015-02-18 DIAGNOSIS — J45909 Unspecified asthma, uncomplicated: Secondary | ICD-10-CM | POA: Insufficient documentation

## 2015-02-18 MED ORDER — LIDOCAINE HCL (PF) 2 % IJ SOLN
0.0000 mL | Freq: Once | INTRAMUSCULAR | Status: AC | PRN
  Administered 2015-02-18: 10 mL via INTRADERMAL
  Filled 2015-02-18: qty 10

## 2015-02-18 MED ORDER — SULFAMETHOXAZOLE-TRIMETHOPRIM 800-160 MG PO TABS: 1.0000 | ORAL_TABLET | Freq: Two times a day (BID) | ORAL | Status: AC

## 2015-02-18 NOTE — ED Notes (Signed)
Patient states recurrent boil to L buttock.   Patient states didn't take anything for pain at home, but has been using warm compresses and taking hot baths to relieve pain.

## 2015-02-18 NOTE — ED Notes (Signed)
Assisted PA with draining abscess.

## 2015-02-18 NOTE — Discharge Instructions (Signed)
Return to the emergency room with worsening of symptoms, new symptoms or with symptoms that are concerning, especially fevers, increased ,redness, swelling, red streaks.  Please take all of your antibiotics until finished!   You may develop abdominal discomfort or diarrhea from the antibiotic.  You may help offset this with probiotics which you can buy or get in yogurt. Do not eat  or take the probiotics until 2 hours after your antibiotic.  Follow up with urgent care in 2 days for wound recheck. Read below information and follow recommendations.  Abscess Care After An abscess (also called a boil or furuncle) is an infected area that contains a collection of pus. Signs and symptoms of an abscess include pain, tenderness, redness, or hardness, or you may feel a moveable soft area under your skin. An abscess can occur anywhere in the body. The infection may spread to surrounding tissues causing cellulitis. A cut (incision) by the surgeon was made over your abscess and the pus was drained out. Gauze may have been packed into the space to provide a drain that will allow the cavity to heal from the inside outwards. The boil may be painful for 5 to 7 days. Most people with a boil do not have high fevers. Your abscess, if seen early, may not have localized, and may not have been lanced. If not, another appointment may be required for this if it does not get better on its own or with medications. HOME CARE INSTRUCTIONS   Only take over-the-counter or prescription medicines for pain, discomfort, or fever as directed by your caregiver.  When you bathe, soak and then remove gauze or iodoform packs at least daily or as directed by your caregiver. You may then wash the wound gently with mild soapy water. Repack with gauze or do as your caregiver directs. SEEK IMMEDIATE MEDICAL CARE IF:   You develop increased pain, swelling, redness, drainage, or bleeding in the wound site.  You develop signs of generalized  infection including muscle aches, chills, fever, or a general ill feeling.  An oral temperature above 102 F (38.9 C) develops, not controlled by medication. See your caregiver for a recheck if you develop any of the symptoms described above. If medications (antibiotics) were prescribed, take them as directed. Document Released: 06/23/2005 Document Revised: 02/27/2012 Document Reviewed: 02/18/2008 [facility] Patient Information 2015 [place], [place]. This information is not intended to replace advice given to you by your health care provider. Make sure you discuss any questions you have with your health care provider.

## 2015-02-18 NOTE — ED Provider Notes (Signed)
CSN: [id number]     Arrival date & time 02/18/15  0927 History  This chart was scribed for Travis Conroy, PA-C working with Vanetta Mulders, MD by Elveria Rising, ED Scribe. This patient was seen in room TR10C/TR10C and the patient's care was started at 10:07 AM.   Chief Complaint  Patient presents with  . Abscess   The history is provided by the patient. No language interpreter was used.   HPI Comments: Travis Soto is a 34 y.o. male who presents to the Emergency Department complaining of abscess to left lower medial buttocks for 2.5 days. Patient reports pain with sitting and walking. Patient reports treatment with warm compresses and hot baths, but denies improvement. Patient is uncertain of drainage.  Patient shares history of reoccurring abscess in the same location but states that this episode is the worst. Patient states that with treatment the abscess typically resolves within a few days, but this one has not. No history of DM.   Past Medical History  Diagnosis Date  . Asthma    Past Surgical History  Procedure Laterality Date  . Shoulder surgery Left 07/10/2012   No family history on file. History  Substance Use Topics  . Smoking status: Former Smoker    Types: Cigarettes  . Smokeless tobacco: Not on file  . Alcohol Use: No    Review of Systems  Constitutional: Negative for fever and chills.  Gastrointestinal: Negative for nausea, vomiting and abdominal pain.  Skin: Negative for color change.       Abscess    Allergies  Coffee flavor; Mushroom extract complex; Tomato; and Food  Home Medications   Prior to Admission medications   Medication Sig Start Date End Date Taking? Authorizing Provider  acetaminophen (TYLENOL) 500 MG tablet Take 1,000 mg by mouth every 6 (six) hours as needed.    Historical Provider, MD  aspirin-acetaminophen-caffeine (EXCEDRIN MIGRAINE) [phone] MG per tablet Take 2 tablets by mouth every 8 (eight) hours as needed (for tooth pain).      Historical Provider, MD  clindamycin (CLEOCIN) 150 MG capsule Take 2 capsules (300 mg total) by mouth 3 (three) times daily. 03/13/14   Shari A Upstill, PA-C  oxyCODONE-acetaminophen (PERCOCET/ROXICET) 5-325 MG per tablet Take 1-2 tablets by mouth every 4 (four) hours as needed for severe pain. 03/13/14   Shari A Upstill, PA-C  sulfamethoxazole-trimethoprim (BACTRIM DS,SEPTRA DS) 800-160 MG per tablet Take 1 tablet by mouth 2 (two) times daily. 02/18/15 02/25/15  Louann Sjogren, PA-C   .tirBP 125/86 mmHg  Pulse 78  Temp(Src) 97.5 F (36.4 C) (Oral)  Resp 18  SpO2 100% Physical Exam  Constitutional: He appears well-developed and well-nourished. No distress.  HENT:  Head: Normocephalic and atraumatic.  Eyes: Conjunctivae are normal. Right eye exhibits no discharge. Left eye exhibits no discharge.  Pulmonary/Chest: Effort normal. No respiratory distress.  Neurological: He is alert. Coordination normal.  Skin: He is not diaphoretic.  1-2 cm area of fluctuance to left about 3 cm from midline. Mild surrounding erythema and induration.  Psychiatric: He has a normal mood and affect. His behavior is normal.  Nursing note and vitals reviewed.   ED Course  Procedures (including critical care time)  INCISION AND DRAINAGE PROCEDURE NOTE: Patient identification was confirmed and verbal consent was obtained. This procedure was performed by Travis Conroy, PA-C at 10:53 AM. Site: left buttock Sterile procedures observed Needle size: 27 gauge Anesthetic used (type and amt): lidocaine 2% without epi  Drainage: small amount  Complexity:  Complex Packing used: none  Site anesthetized, incision made over site, wound drained and explored loculations, rinsed with copious amounts of normal saline, wound packed with sterile gauze, covered with dry, sterile dressing.  Pt tolerated procedure well without complications.  Instructions for care discussed verbally and pt provided with additional  written instructions for homecare and f/u.   COORDINATION OF CARE: 10:13 AM- Plans to incise and drain. Discussed treatment plan with patient at bedside and patient agreed to plan.   Labs Review Labs Reviewed - No data to display  Imaging Review No results found.   EKG Interpretation None      MDM   Final diagnoses:  Abscess of left buttock   Patient with skin abscess amenable to incision and drainage.  Abscess was not large enough to warrant packing or drain,  wound recheck in 2 days. Encouraged home warm soaks and flushing.  Mild signs of cellulitis is surrounding skin.  Will d/c to home with Bactrim due to location of abscess. No report of immunosuppression.  Discussed return precautions with patient. Discussed all results and patient verbalizes understanding and agrees with plan.  I personally performed the services described in this documentation, which was scribed in my presence. The recorded information has been reviewed and is accurate.    Louann SjogrenVictoria L Tyran Huser, PA-C 02/18/15 1109  Vanetta MuldersScott Zackowski, MD 02/19/15 1734

## 2015-05-13 ENCOUNTER — Encounter (HOSPITAL_COMMUNITY): Payer: Self-pay | Admitting: Emergency Medicine

## 2015-05-13 ENCOUNTER — Emergency Department (HOSPITAL_COMMUNITY)
Admission: EM | Admit: 2015-05-13 | Discharge: 2015-05-13 | Disposition: A | Discharge status: Home / Self Care | Payer: Self-pay | Attending: Emergency Medicine | Admitting: Emergency Medicine

## 2015-05-13 ENCOUNTER — Emergency Department (HOSPITAL_COMMUNITY): Payer: Self-pay

## 2015-05-13 DIAGNOSIS — L0231 Cutaneous abscess of buttock: Secondary | ICD-10-CM | POA: Insufficient documentation

## 2015-05-13 DIAGNOSIS — Z79899 Other long term (current) drug therapy: Secondary | ICD-10-CM | POA: Insufficient documentation

## 2015-05-13 DIAGNOSIS — Z87891 Personal history of nicotine dependence: Secondary | ICD-10-CM | POA: Insufficient documentation

## 2015-05-13 DIAGNOSIS — L0291 Cutaneous abscess, unspecified: Secondary | ICD-10-CM

## 2015-05-13 DIAGNOSIS — Z792 Long term (current) use of antibiotics: Secondary | ICD-10-CM | POA: Insufficient documentation

## 2015-05-13 DIAGNOSIS — J45909 Unspecified asthma, uncomplicated: Secondary | ICD-10-CM | POA: Insufficient documentation

## 2015-05-13 LAB — COMPREHENSIVE METABOLIC PANEL
ALT: 20 U/L (ref 17–63)
ANION GAP: 6 (ref 5–15)
AST: 25 U/L (ref 15–41)
Albumin: 3.2 g/dL — ABNORMAL LOW (ref 3.5–5.0)
Alkaline Phosphatase: 64 U/L (ref 38–126)
BILIRUBIN TOTAL: 1.1 mg/dL (ref 0.3–1.2)
BUN: 10 mg/dL (ref 6–20)
CO2: 26 mmol/L (ref 22–32)
CREATININE: 1.18 mg/dL (ref 0.61–1.24)
Calcium: 8.6 mg/dL — ABNORMAL LOW (ref 8.9–10.3)
Chloride: 109 mmol/L (ref 101–111)
GFR calc Af Amer: >60 mL/min (ref >60)
GFR calc non Af Amer: >60 mL/min (ref >60)
GLUCOSE: 108 mg/dL — AB (ref 65–99)
POTASSIUM: 4.3 mmol/L (ref 3.5–5.1)
SODIUM: 141 mmol/L (ref 135–145)
TOTAL PROTEIN: 5.5 g/dL — AB (ref 6.5–8.1)

## 2015-05-13 LAB — CBC WITH DIFFERENTIAL/PLATELET
Basophils Absolute: 0 10*3/uL (ref 0.0–0.1)
Basophils Relative: 0 % (ref 0–1)
EOS ABS: 0.2 10*3/uL (ref 0.0–0.7)
EOS PCT: 3 % (ref 0–5)
HEMATOCRIT: 43.3 % (ref 39.0–52.0)
HEMOGLOBIN: 14.8 g/dL (ref 13.0–17.0)
Lymphocytes Relative: 35 % (ref 12–46)
Lymphs Abs: 2.5 10*3/uL (ref 0.7–4.0)
MCH: 30.3 pg (ref 26.0–34.0)
MCHC: 34.2 g/dL (ref 30.0–36.0)
MCV: 88.7 fL (ref 78.0–100.0)
MONOS PCT: 7 % (ref 3–12)
Monocytes Absolute: 0.5 10*3/uL (ref 0.1–1.0)
NEUTROS PCT: 55 % (ref 43–77)
Neutro Abs: 3.8 10*3/uL (ref 1.7–7.7)
PLATELETS: 277 10*3/uL (ref 150–400)
RBC: 4.88 MIL/uL (ref 4.22–5.81)
RDW: 14.1 % (ref 11.5–15.5)
WBC: 7.1 10*3/uL (ref 4.0–10.5)

## 2015-05-13 MED ORDER — IOHEXOL 300 MG/ML  SOLN
100.0000 mL | Freq: Once | INTRAMUSCULAR | Status: AC | PRN
  Administered 2015-05-13: 100 mL via INTRAVENOUS

## 2015-05-13 MED ORDER — LIDOCAINE-EPINEPHRINE (PF) 2 %-1:200000 IJ SOLN
10.0000 mL | Freq: Once | INTRAMUSCULAR | Status: AC
  Administered 2015-05-13: 10 mL
  Filled 2015-05-13: qty 20

## 2015-05-13 MED ORDER — CEPHALEXIN 250 MG PO CAPS: 250.0000 mg | ORAL_CAPSULE | Freq: Four times a day (QID) | ORAL | Status: DC

## 2015-05-13 MED ORDER — SULFAMETHOXAZOLE-TRIMETHOPRIM 800-160 MG PO TABS
1.0000 | ORAL_TABLET | Freq: Two times a day (BID) | ORAL | Status: DC
Start: 2015-05-13 — End: 2015-05-16

## 2015-05-13 NOTE — ED Provider Notes (Signed)
CSN: [id number]     Arrival date & time 05/13/15  1620 History  This chart was scribed for Travis MuttonMarissa Tnia Anglada, PA-C, working with Arby BarretteMarcy Pfeiffer, MD by Chestine SporeSoijett Blue, ED Scribe. The patient was seen in room TR11C/TR11C at 4:52 PM.    Chief Complaint  Patient presents with  . Abscess      The history is provided by the patient. No language interpreter was used.    HPI Comments: Travis Soto is a 34 y.o. male with PMHX of asthma who presents to the Emergency Department complaining of abscess onset 3.5 days ago. Patient reported that the abscess is localized to the left buttocks, reported that over the past couple of days the abscesses grown in size. Reported that it's tender to touch and tender to sit on his buttocks. Stated that he has been applying warm compressions. Stated that he has history of abscesses in the past. Denied bleeding, drainage, fever, chills, numbness, tingling, weakness, back pain, urinary bladder incontinence. Denied IV drug abuse. PCP none   Past Medical History  Diagnosis Date  . Asthma    Past Surgical History  Procedure Laterality Date  . Shoulder surgery Left 07/10/2012   History reviewed. No pertinent family history. History  Substance Use Topics  . Smoking status: Former Smoker    Types: Cigarettes  . Smokeless tobacco: Not on file  . Alcohol Use: No    Review of Systems  Constitutional: Negative for fever and chills.  Gastrointestinal: Negative for nausea, vomiting and abdominal pain.       No bowel incontinence  Genitourinary: Negative for scrotal swelling, penile pain and testicular pain.       No bladder incontinence.  Skin: Positive for wound (abscess to the left buttock). Negative for color change.  Neurological: Negative for weakness and numbness.      Allergies  Coffee flavor; Mushroom extract complex; Tomato; and Food  Home Medications   Prior to Admission medications   Medication Sig Start Date End Date Taking? Authorizing Provider   acetaminophen (TYLENOL) 500 MG tablet Take 1,000 mg by mouth every 6 (six) hours as needed.    Historical Provider, MD  aspirin-acetaminophen-caffeine (EXCEDRIN MIGRAINE) [phone] MG per tablet Take 2 tablets by mouth every 8 (eight) hours as needed (for tooth pain).     Historical Provider, MD  clindamycin (CLEOCIN) 150 MG capsule Take 2 capsules (300 mg total) by mouth 3 (three) times daily. 03/13/14   Elpidio AnisShari Upstill, PA-C  oxyCODONE-acetaminophen (PERCOCET/ROXICET) 5-325 MG per tablet Take 1-2 tablets by mouth every 4 (four) hours as needed for severe pain. 03/13/14   Shari Upstill, PA-C   BP 120/73 mmHg  Pulse 91  Temp(Src) 98.6 F (37 C) (Oral)  Resp 20  SpO2 96% Physical Exam  Constitutional: He is oriented to person, place, and time. He appears well-developed and well-nourished. No distress.  HENT:  Head: Normocephalic and atraumatic.  Eyes: Conjunctivae and EOM are normal. Right eye exhibits no discharge. Left eye exhibits no discharge.  Neck: Normal range of motion. Neck supple.  Cardiovascular: Normal rate, regular rhythm and normal heart sounds.  Exam reveals no friction rub.   No murmur heard. Pulses:      Radial pulses are 2+ on the right side, and 2+ on the left side.  Pulmonary/Chest: Effort normal and breath sounds normal. No respiratory distress. He has no wheezes. He has no rales.  Abdominal: Soft. Bowel sounds are normal. He exhibits no distension. There is no tenderness. There is no rebound  and no guarding.  Genitourinary:     Rectal exam: Negative areas of swelling, erythema, warmth upon palpation, red streaks. Negative findings of cellulitic infection. Tenderness identified to the inferior gluteal fold, at approximately 9 o'clock from the anus. Negative active drainage or bleeding noted. Negative pustular or vesicular presentation. Exquisite tenderness upon palpation to internal rectal region at approximately 9 o'clock. Negative tenderness upon palpation to the right  aspect of the rectal region. Strong sphincter tone. Negative bright red blood on glove or per rectum. Exam chaperoned with nurse, Clydie Braun.  Musculoskeletal: Normal range of motion.  Neurological: He is alert and oriented to person, place, and time. No cranial nerve deficit. He exhibits normal muscle tone. Coordination normal.  Skin: Skin is warm and dry. No rash noted. He is not diaphoretic. No erythema.  Psychiatric: He has a normal mood and affect. His behavior is normal. Thought content normal.  Nursing note and vitals reviewed.   ED Course  Procedures (including critical care time) DIAGNOSTIC STUDIES: Oxygen Saturation is 96% on RA, nl by my interpretation.    COORDINATION OF CARE: 5:02 PM-Discussed treatment plan which includes CT pelvis with contrast, CBC, and IV fluids with pt at bedside and pt agreed to plan.   Results for orders placed or performed during the hospital encounter of 05/13/15  CBC with Differential/Platelet  Result Value Ref Range   WBC 7.1 4.0 - 10.5 K/uL   RBC 4.88 4.22 - 5.81 MIL/uL   Hemoglobin 14.8 13.0 - 17.0 g/dL   HCT 16.1 09.6 - 04.5 %   MCV 88.7 78.0 - 100.0 fL   MCH 30.3 26.0 - 34.0 pg   MCHC 34.2 30.0 - 36.0 g/dL   RDW 40.9 81.1 - 91.4 %   Platelets 277 150 - 400 K/uL   Neutrophils Relative % 55 43 - 77 %   Neutro Abs 3.8 1.7 - 7.7 K/uL   Lymphocytes Relative 35 12 - 46 %   Lymphs Abs 2.5 0.7 - 4.0 K/uL   Monocytes Relative 7 3 - 12 %   Monocytes Absolute 0.5 0.1 - 1.0 K/uL   Eosinophils Relative 3 0 - 5 %   Eosinophils Absolute 0.2 0.0 - 0.7 K/uL   Basophils Relative 0 0 - 1 %   Basophils Absolute 0.0 0.0 - 0.1 K/uL  Comprehensive metabolic panel  Result Value Ref Range   Sodium 141 135 - 145 mmol/L   Potassium 4.3 3.5 - 5.1 mmol/L   Chloride 109 101 - 111 mmol/L   CO2 26 22 - 32 mmol/L   Glucose, Bld 108 (H) 65 - 99 mg/dL   BUN 10 6 - 20 mg/dL   Creatinine, Ser 7.82 0.61 - 1.24 mg/dL   Calcium 8.6 (L) 8.9 - 10.3 mg/dL   Total Protein  5.5 (L) 6.5 - 8.1 g/dL   Albumin 3.2 (L) 3.5 - 5.0 g/dL   AST 25 15 - 41 U/L   ALT 20 17 - 63 U/L   Alkaline Phosphatase 64 38 - 126 U/L   Total Bilirubin 1.1 0.3 - 1.2 mg/dL   GFR calc non Af Amer >60 >60 mL/min   GFR calc Af Amer >60 >60 mL/min   Anion gap 6 5 - 15    Labs Review Labs Reviewed  CBC WITH DIFFERENTIAL/PLATELET  COMPREHENSIVE METABOLIC PANEL    Imaging Review Ct Pelvis W Contrast  05/13/2015   CLINICAL DATA:  Potential abscess involving the left buttocks. History of prior abscesses.  EXAM: CT PELVIS  WITH CONTRAST  TECHNIQUE: Multidetector CT imaging of the pelvis was performed using the standard protocol following the bolus administration of intravenous contrast.  CONTRAST:  OMNIPAQUE IOHEXOL 300 MG/ML  SOLN  COMPARISON:  None.  FINDINGS: There is a tiny (approximately 2.0 x 0.7 cm apparently peripherally enhancing fluid collection within the subcutaneous tissues about the caudal medial aspect of the left buttocks (image 51, series 301; coronal image 93, series 302). There is no definitive extension or communication of this superficial presumed abscess with the rectum. No subcutaneous emphysema. No radiopaque foreign body.  Visualized loops of bowel are normal. No evidence of enteric obstruction. Normal appearance of the prostate gland and urinary bladder.  No acute or aggressive osseous abnormalities  Visualized vascular structures are normal. No pelvic or inguinal lymphadenopathy.  IMPRESSION: Tiny (approximately 2 cm) peripherally enhancing fluid collection/abscess within the subcutaneous tissues about the caudal medial aspect of the left buttocks. There is no definitive extension or communication of this superficial presumed abscess with the rectum.   Electronically Signed   By: Simonne Come M.D.   On: 05/13/2015 20:09     EKG Interpretation None       5:10 PM Discussed case in great detail with attending physician, Dr. Judie Petit. Pfeiffer - in accordance to plan.     8:46 PM Dr. Donnald Garre at bedside assessing patient. Labs and imaging reviewed.  Dr. Donnald Garre to drain abscess - reported to order lidocaine with epi.   9:02 PM Dr. Donnald Garre at bedside performing I&D.   MDM   Final diagnoses:  Abscess    Medications  iohexol (OMNIPAQUE) 300 MG/ML solution 100 mL (100 mLs Intravenous Contrast Given 05/13/15 1933)  lidocaine-EPINEPHrine (XYLOCAINE W/EPI) 2 %-1:200000 (PF) injection 10 mL (10 mLs Infiltration Given 05/13/15 2053)    Filed Vitals:   05/13/15 1643 05/13/15 2009  BP: 120/73 104/58  Pulse: 91 70  Temp: 98.6 F (37 C) 98.2 F (36.8 C)  TempSrc: Oral Oral  Resp: 20 16  SpO2: 96% 100%    I personally performed the services described in this documentation, which was scribed in my presence. The recorded information has been reviewed and is accurate.  Patient presenting to the ED with left buttock abscess that he noticed approximately 3 and a half days ago. Patient reports that the pain is localized to the left buttock and reported that it has gotten larger in size of the past couple of days. Stated that he has pain with bowel movements as well as with sitting. States he has history of abscesses in the past.  CBC negative elevated leukocytosis. Hemoglobin 14.8, hematocrit 43.3. CMP unremarkable. CT pelvis with contrast noted tiny approximately 2 cm peripherally enhancing fluid collection/abscess within the subcutaneous tissues about the caudal medial aspect of the left buttocks-no definitive extension or communication of this suspicious presumed abscess within the rectum. Attending physician, Dr. Donnald Garre, performed incision and drainage of abscess with small amount of pus drainage. Packing placed. Patient stable, afebrile. Patient not septic appearing. Discharged patient. Discharged patient with anti-biotics. Discussed with patient to report back to the ED with an approximately 2 days for site to be reassessed for packing to be removed. Discussed  with patient to closely monitor symptoms and if symptoms are to worsen or change to report back to the ED - strict return instructions given.  Patient agreed to plan of care, understood, all questions answered.   Travis Mutton, PA-C 05/13/15 2210  Arby Barrette, MD 05/15/15 [phone]

## 2015-05-13 NOTE — ED Notes (Signed)
Pt c/o left sided buttocks abscess with pain and swelling

## 2015-05-13 NOTE — Discharge Instructions (Signed)
Please call your doctor for a followup appointment within 24-48 hours. When you talk to your doctor please let them know that you were seen in the emergency department and have them acquire all of your records so that they can discuss the findings with you and formulate a treatment plan to fully care for your new and ongoing problems. Please follow-up with health and wellness Center Please follow-up with emergency department within 2 days for reassessment. Will need to be reassessed within 2 days for packing to be removed and site to be reassessed. Please perform warm soaks Please take anti-by out excess prescribed Please continue to monitor symptoms closely and if symptoms are to worsen or change (fever greater than 101, chills, sweating, nausea, vomiting, chest pain, shortness of breathe, difficulty breathing, weakness, numbness, tingling, worsening or changes to pain pattern, swelling, redness, red streaks, worsening or changes to pain pattern) please report back to the Emergency Department immediately.    Abscess An abscess is an infected area that contains a collection of pus and debris.It can occur in almost any part of the body. An abscess is also known as a furuncle or boil. CAUSES  An abscess occurs when tissue gets infected. This can occur from blockage of oil or sweat glands, infection of hair follicles, or a minor injury to the skin. As the body tries to fight the infection, pus collects in the area and creates pressure under the skin. This pressure causes pain. People with weakened immune systems have difficulty fighting infections and get certain abscesses more often.  SYMPTOMS Usually an abscess develops on the skin and becomes a painful mass that is red, warm, and tender. If the abscess forms under the skin, you may feel a moveable soft area under the skin. Some abscesses break open (rupture) on their own, but most will continue to get worse without care. The infection can spread deeper  into the body and eventually into the bloodstream, causing you to feel ill.  DIAGNOSIS  Your caregiver will take your medical history and perform a physical exam. A sample of fluid may also be taken from the abscess to determine what is causing your infection. TREATMENT  Your caregiver may prescribe antibiotic medicines to fight the infection. However, taking antibiotics alone usually does not cure an abscess. Your caregiver may need to make a small cut (incision) in the abscess to drain the pus. In some cases, gauze is packed into the abscess to reduce pain and to continue draining the area. HOME CARE INSTRUCTIONS   Only take over-the-counter or prescription medicines for pain, discomfort, or fever as directed by your caregiver.  If you were prescribed antibiotics, take them as directed. Finish them even if you start to feel better.  If gauze is used, follow your caregiver's directions for changing the gauze.  To avoid spreading the infection:  Keep your draining abscess covered with a bandage.  Wash your hands well.  Do not share personal care items, towels, or whirlpools with others.  Avoid skin contact with others.  Keep your skin and clothes clean around the abscess.  Keep all follow-up appointments as directed by your caregiver. SEEK MEDICAL CARE IF:   You have increased pain, swelling, redness, fluid drainage, or bleeding.  You have muscle aches, chills, or a general ill feeling.  You have a fever. MAKE SURE YOU:   Understand these instructions.  Will watch your condition.  Will get help right away if you are not doing well or get worse.  Document Released: 09/14/2005 Document Revised: 06/05/2012 Document Reviewed: 02/17/2012 [facility] Patient Information 2015 [place]. This information is not intended to replace advice given to you by your health care provider. Make sure you discuss any questions you have with your health care provider.   Emergency Department  Resource Guide 1) Find a Doctor and Pay Out of Pocket Although you won't have to find out who is covered by your insurance plan, it is a good idea to ask around and get recommendations. You will then need to call the office and see if the doctor you have chosen will accept you as a new patient and what types of options they offer for patients who are self-pay. Some doctors offer discounts or will set up payment plans for their patients who do not have insurance, but you will need to ask so you aren't surprised when you get to your appointment.  2) Contact Your Local Health Department Not all health departments have doctors that can see patients for sick visits, but many do, so it is worth a call to see if yours does. If you don't know where your local health department is, you can check in your phone book. The CDC also has a tool to help you locate your state's health department, and many state websites also have listings of all of their local health departments.  3) Find a Walk-in Clinic If your illness is not likely to be very severe or complicated, you may want to try a walk in clinic. These are popping up all over the country in pharmacies, drugstores, and shopping centers. They're usually staffed by nurse practitioners or physician assistants that have been trained to treat common illnesses and complaints. They're usually fairly quick and inexpensive. However, if you have serious medical issues or chronic medical problems, these are probably not your best option.  No Primary Care Doctor: - Call Health Connect at  [phone] - they can help you locate a primary care doctor that  accepts your insurance, provides certain services, etc. - Physician Referral Service- [phone]  Chronic Pain Problems: Organization         Address  Phone   Notes  [facility]  [phone] Patients need to be referred by their primary care doctor.   Medication Assistance: Organization          Address  Phone   Notes  [facility] [address] [place], [place] [address] [phone] --Must be a resident of [place] -- Must have NO insurance coverage whatsoever (no Medicaid/ Medicare, etc.) -- The pt. MUST have a primary care doctor that directs their care regularly and follows them in the community   MedAssist  [phone]   [employer]  [phone]) [phone]    Agencies that provide inexpensive medical care: Organization         Address  Phone   Notes  [facility]  [phone]   [facility]    [phone]   [facility] [address] [place], [place] [address] [phone]   [facility] [address], [place] [phone]   Planned Parenthood    [phone]   [facility]    [phone]   Community Health and [facility]  [address], [place] Phone:  [phone], Fax:  [phone] Hours of Operation:  9 am - 6 pm, M-F.  Also accepts Medicaid/Medicare and self-pay.  [facility]  [address], [place] Phone: [phone], Fax: [phone]. Hours of Operation:  8:30 am - 5:30 pm, M-F.  Also accepts Medicaid and self-pay.  [facility] [place] [address] [place] Phone: [phone]   [facility] [address] Natasha Bence [place], [place] [phone], Ext. [phone] Mondays & Thursdays: 7-9 AM.  First 15 patients are seen on a first come, first serve basis.    Medicaid-accepting [facility] Providers:  Organization         Address  Phone   Notes  [facility] [address], [place] [phone] Also accepts self-pay patients.  [facility] [address] Laurell Josephs [place], [place]  [phone]   [facility] [address], [place] [phone]   [facility] [address] [place] [phone]   Renaye Rakers [address], [place]   [phone] Only accepts [place] Access [place] patients after they have their name applied to their card.   Self-Pay (no insurance) in [facility]:  Organization         Address  Phone   Notes  Sickle Cell Patients, [facility] [address] [place], [place] [phone]   [facility] [address] [place], [place] [phone]   [facility] [place]  [address],  [phone]   [facility]/Dr. Osei-Bonsu  [address], [place] or [address], [place] [phone] Phone number for both [place] and [place] locations is the same.  Urgent Medical and [facility] [address] [place] [phone]   [facility] [address] [place] or [address] [phone]   [facility] [address] [place] [phone], phone; [phone], fax Sees patients 1st and 3rd Saturday of every month.  Must not qualify for public or private insurance (i.e. Medicaid, Medicare, [place] Health Choice, Veterans' Benefits)  Household income should be no more than 200% of the poverty level The clinic cannot treat you if you are pregnant or think you are pregnant  Sexually transmitted diseases are not treated at the clinic.    Dental Care: Organization         Address  Phone  Notes  [facility] [address] [place], [place] [phone] Accepts children up to age 3 who are enrolled in [place] or [place] Health Choice; pregnant women with a Medicaid card; and children who have applied for Medicaid or [place] Health Choice, but were declined, whose parents can pay a reduced fee at time of service.  [facility] of [facility]  [address], [place] [phone] Accepts children up to age 38 who are enrolled in [place] or [place] Health Choice; pregnant women with a Medicaid card; and children who have applied for Medicaid or  Health Choice, but were declined, whose parents can pay a reduced fee at time of service.  [facility]  [address] [place], [place] [phone] Patients are seen by appointment only. Walk-ins are not accepted. [facility] will see patients 63 years of age and older. Monday - Tuesday (  8am-5pm) Most Wednesdays (8:30-5pm) $30 per visit, cash only  [facility] Adult [facility] Access PRO[facility]  [address], [facility] [phone] Patients are seen by appointment only. Walk-ins are not accepted. [facility] will see patients 22 years of age and older. One Wednesday Evening (Monthly: Volunteer Based).  $30 per visit, cash only  [employer]  [phone] for adults; Children under age 3, call Graduate Pediatric Dentistry at [phone]. Children aged 69-14, please call [phone] to request a pediatric application.  Dental services are provided in all areas of dental care including fillings, crowns and bridges, complete and partial dentures, implants, gum treatment, root canals, and extractions. Preventive care is also provided. Treatment is provided to both adults and children. Patients are selected via a lottery and there is often a waiting list.   [facility] [address] [place]  [phone] [email or web address]   [facility] [address] [place], [place] [phone], Ext. [phone] Second and Fourth Thursday of each month, opens at 6:30 AM; Clinic ends at 9 AM.  Patients are seen on a first-come first-served basis, and a limited number are seen during each clinic.   [facility]  [address] Ether Griffins [place], [place] [phone]   Eligibility Requirements You must  have lived in [place], [place], or [place] counties for at least the last three months.   You cannot be eligible for state or federal sponsored [place], including [employer], [place], or [employer].   You generally cannot be eligible for healthcare insurance through your employer.    How to apply: Eligibility screenings are held every Tuesday and Wednesday afternoon from 1:00 pm until 4:00 pm. You do not need an appointment for the interview!  [facility] [address], [place], [place] [phone]   [facility] Health Department  [phone]   [facility] Health Department  [phone]   [facility] Health Department  [phone]    Behavioral Health Resources in the Community: Intensive Outpatient Programs Organization         Address  Phone  Notes  [facility] Services [address], [place], [place] [phone]   [facility] [address] [place], [place] [phone]   [facility] [address] [place], [place]  [phone]   [facility] [address]  [place], [place] [phone] or [phone]   Substance Abuse Resources Organization         Address  Phone  Notes  [facility] Drug Services  [phone]   [facility]  [phone]   [facility] [place]  [phone]   Floydene Flock  (570) [phone]   Residential & Outpatient Substance Abuse Program  ([phone]   Psychological Services Organization         Address  Phone  Notes  [facility]  [phone]   [facility] Services  ([phone]   [facility] [address] [place] [phone] or [phone]    Mobile Crisis Teams Organization         Address  Phone  Notes  [facility], Mobile Crisis Care Unit  [phone]   [facility]  [address] [place], [place] [phone]   Doristine Locks [address] [place] [phone]    Self-Help/Support Groups Organization         Address  Phone  Notes  [facility] [place] - variety of support groups  336- I7437963 Call for more information  Narcotics Anonymous (NA), [facility] [address], [employer] [place]  2 meetings at this location   Statistician         Address  Phone  Notes  [facility] [address],    [place]  [phone]   [facility]  [address], [place] [address], [place], [place] [phone]   [facility] [address] [place], [place] [phone] Admissions: 8am-3pm M-F  [facility] [address],    [place], [place] [phone]   [facility] [address] [place], [place], [place] [phone]   [facility] [address],  [place], [place] [phone]   [facility] - Intensive Outpatient [address] Laurell Josephs [address], [place], [place] [phone]   [facility] (Addiction Recovery [facility] [address] [place]  [place], [place] [phone] or [phone]   [facility] ([facility]) [address], [place], [place] [phone] Accepts Medicaid  [facility] [place] [facility] [address],  [place] [phone] Substance Abuse/Addiction Treatment   [facility] Organization         Address  Phone  Notes  [facility]  ([phone]) [phone]   Angie Fava, PhD [address] Ervin Knack [place], [place]   (720)[phone] or ([phone]   [facility] Behavioral   [address] [place], [place] [phone]   [facility] [address] [place], [place] [phone] Insurance/Medicaid/sponsorship through [facility] [address]                                    [place], [place] [phone] Therapy/tele-psych/case  [facility] [address], [place] [phone]    Dr. Lolly Mustache  ([phone]   [facility] [place]  [employer] [facility]. 1) [address] [place] 2) [address] [place] 3)  [address], [place] [phone]  [phone]   [facility] Child Abuse Hotline 302-614-9195 or [phone] (After Hours)

## 2015-05-13 NOTE — ED Notes (Signed)
Patient transported to CT 

## 2015-05-16 ENCOUNTER — Encounter (HOSPITAL_COMMUNITY): Payer: Self-pay | Admitting: *Deleted

## 2015-05-16 ENCOUNTER — Emergency Department (HOSPITAL_COMMUNITY)
Admission: EM | Admit: 2015-05-16 | Discharge: 2015-05-16 | Disposition: A | Discharge status: Home / Self Care | Payer: Self-pay | Attending: Emergency Medicine | Admitting: Emergency Medicine

## 2015-05-16 DIAGNOSIS — Z792 Long term (current) use of antibiotics: Secondary | ICD-10-CM | POA: Insufficient documentation

## 2015-05-16 DIAGNOSIS — Z4801 Encounter for change or removal of surgical wound dressing: Secondary | ICD-10-CM | POA: Insufficient documentation

## 2015-05-16 DIAGNOSIS — Z79899 Other long term (current) drug therapy: Secondary | ICD-10-CM | POA: Insufficient documentation

## 2015-05-16 DIAGNOSIS — Z87891 Personal history of nicotine dependence: Secondary | ICD-10-CM | POA: Insufficient documentation

## 2015-05-16 DIAGNOSIS — J45909 Unspecified asthma, uncomplicated: Secondary | ICD-10-CM | POA: Insufficient documentation

## 2015-05-16 DIAGNOSIS — Z7982 Long term (current) use of aspirin: Secondary | ICD-10-CM | POA: Insufficient documentation

## 2015-05-16 DIAGNOSIS — Z5189 Encounter for other specified aftercare: Secondary | ICD-10-CM

## 2015-05-16 MED ORDER — SULFAMETHOXAZOLE-TRIMETHOPRIM 800-160 MG PO TABS: 1.0000 | ORAL_TABLET | Freq: Two times a day (BID) | ORAL | Status: AC

## 2015-05-16 MED ORDER — CEPHALEXIN 250 MG PO CAPS: 250.0000 mg | ORAL_CAPSULE | Freq: Four times a day (QID) | ORAL | Status: DC

## 2015-05-16 NOTE — Discharge Instructions (Signed)
Please call your doctor for a followup appointment within 24-48 hours. When you talk to your doctor please let them know that you were seen in the emergency department and have them acquire all of your records so that they can discuss the findings with you and formulate a treatment plan to fully care for your new and ongoing problems. Please follow-up with her primary care provider Please report back to the emergency department with an approximately 2 days to be seen and reassessed for wound to be checked Please continue to apply warm compressions and warm soaks daily Please massage the area  Please continue to monitor symptoms closely and if symptoms are to worsen or change (fever greater than 101, chills, sweating, nausea, vomiting, chest pain, shortness of breathe, difficulty breathing, weakness, numbness, tingling, worsening or changes to pain pattern, swelling, red streaks, pus drainage, back pain, inability to control urine or bowel movements, pain with bowel movements, blood in the stools, black tarry stools) please report back to the Emergency Department immediately.   Abscess An abscess is an infected area that contains a collection of pus and debris.It can occur in almost any part of the body. An abscess is also known as a furuncle or boil. CAUSES  An abscess occurs when tissue gets infected. This can occur from blockage of oil or sweat glands, infection of hair follicles, or a minor injury to the skin. As the body tries to fight the infection, pus collects in the area and creates pressure under the skin. This pressure causes pain. People with weakened immune systems have difficulty fighting infections and get certain abscesses more often.  SYMPTOMS Usually an abscess develops on the skin and becomes a painful mass that is red, warm, and tender. If the abscess forms under the skin, you may feel a moveable soft area under the skin. Some abscesses break open (rupture) on their own, but most  will continue to get worse without care. The infection can spread deeper into the body and eventually into the bloodstream, causing you to feel ill.  DIAGNOSIS  Your caregiver will take your medical history and perform a physical exam. A sample of fluid may also be taken from the abscess to determine what is causing your infection. TREATMENT  Your caregiver may prescribe antibiotic medicines to fight the infection. However, taking antibiotics alone usually does not cure an abscess. Your caregiver may need to make a small cut (incision) in the abscess to drain the pus. In some cases, gauze is packed into the abscess to reduce pain and to continue draining the area. HOME CARE INSTRUCTIONS   Only take over-the-counter or prescription medicines for pain, discomfort, or fever as directed by your caregiver.  If you were prescribed antibiotics, take them as directed. Finish them even if you start to feel better.  If gauze is used, follow your caregiver's directions for changing the gauze.  To avoid spreading the infection:  Keep your draining abscess covered with a bandage.  Wash your hands well.  Do not share personal care items, towels, or whirlpools with others.  Avoid skin contact with others.  Keep your skin and clothes clean around the abscess.  Keep all follow-up appointments as directed by your caregiver. SEEK MEDICAL CARE IF:   You have increased pain, swelling, redness, fluid drainage, or bleeding.  You have muscle aches, chills, or a general ill feeling.  You have a fever. MAKE SURE YOU:   Understand these instructions.  Will watch your condition.  Will  get help right away if you are not doing well or get worse. Document Released: 09/14/2005 Document Revised: 06/05/2012 Document Reviewed: 02/17/2012 [facility] Patient Information 2015 [place], [place]. This information is not intended to replace advice given to you by your health care provider. Make sure you discuss any  questions you have with your health care provider.   Emergency Department Resource Guide 1) Find a Doctor and Pay Out of Pocket Although you won't have to find out who is covered by your insurance plan, it is a good idea to ask around and get recommendations. You will then need to call the office and see if the doctor you have chosen will accept you as a new patient and what types of options they offer for patients who are self-pay. Some doctors offer discounts or will set up payment plans for their patients who do not have insurance, but you will need to ask so you aren't surprised when you get to your appointment.  2) Contact Your Local Health Department Not all health departments have doctors that can see patients for sick visits, but many do, so it is worth a call to see if yours does. If you don't know where your local health department is, you can check in your phone book. The CDC also has a tool to help you locate your state's health department, and many state websites also have listings of all of their local health departments.  3) Find a Walk-in Clinic If your illness is not likely to be very severe or complicated, you may want to try a walk in clinic. These are popping up all over the country in pharmacies, drugstores, and shopping centers. They're usually staffed by nurse practitioners or physician assistants that have been trained to treat common illnesses and complaints. They're usually fairly quick and inexpensive. However, if you have serious medical issues or chronic medical problems, these are probably not your best option.  No Primary Care Doctor: - Call Health Connect at  [phone] - they can help you locate a primary care doctor that  accepts your insurance, provides certain services, etc. - Physician Referral Service- [phone]  Chronic Pain Problems: Organization         Address  Phone   Notes  [facility]  [phone] Patients need to be referred  by their primary care doctor.   Medication Assistance: Organization         Address  Phone   Notes  [facility] Medication [facility] [address] [place], [place] [address] [phone] --Must be a resident of [facility] -- Must have NO insurance coverage whatsoever (no Medicaid/ Medicare, etc.) -- The pt. MUST have a primary care doctor that directs their care regularly and follows them in the community   Med[facility]ist  [phone]   [employer]  [phone]    Agencies that provide inexpensive medical care: Organization         Address  Phone   Notes  [facility]  [phone]   [facility]    [phone]   [facility] [address] [place], [place] [address] [phone]   [facility] [address] [place] [place] [phone]   Planned Parenthood    [phone]   [facility]    [phone]   [facility]  [address], [place] Phone:  [phone], Fax:  [phone] Hours of Operation:  9 am - 6 pm, M-F.  Also accepts Medicaid/Medicare and self-pay.  [facility]  [address], [place] Phone: [phone], Fax: [phone]. Hours of Operation:  8:30 am - 5:30 pm, M-F.  Also accepts Medicaid and self-pay.  [facility] [place] [address] [place] Phone: [phone]   [facility] [address] Natasha Bence [place], [place] [phone], Ext. [phone] Mondays & Thursdays: 7-9 AM.  First 15 patients are seen on a first come, first serve basis.    Medicaid-accepting [facility] Providers:  Organization         Address  Phone   Notes  [facility] [address], [place] [phone] Also accepts self-pay patients.  [facility] [address] Laurell Josephs [place], [place]  [phone]   [facility] [address], [place] [phone]   [facility] [address] [place] [phone]   Renaye Rakers [address], [place]   [phone] Only accepts [place] Access [place] patients after they have their name applied to their card.   Self-Pay (no insurance) in [facility]:  Organization         Address  Phone   Notes  Sickle Cell Patients, [facility] [address] [place], [place] [phone]   [facility] [address] [place], [place] [phone]   [facility] Eugenio Saenz  [address], [place] [phone]   [facility]/Dr. Osei-Bonsu  [address] [place] or [address], [place] [phone] Phone number for both [place] and [place] locations is the same.  [facility] and [facility] [address], [place] [phone]   [facility] [address], [place] or [address] [phone]   [facility] [address] [place] [phone], phone; [phone], fax Sees patients 1st and 3rd Saturday of every month.  Must not qualify for public or private insurance (i.e. Medicaid, Medicare, [place] Health Choice, Veterans' Benefits)  Household income should be no more than 200% of the poverty level The clinic cannot treat you if you are pregnant or think you are pregnant  Sexually transmitted diseases are not treated at the clinic.    Dental Care: Organization         Address  Phone  Notes  [facility] [place] [facility] [address] [place], [place] [phone] Accepts children up to age 43 who are enrolled in [place] or [place] Health Choice; pregnant women with a Medicaid card; and children who have applied for Medicaid or [place] Health Choice, but were declined, whose parents can pay a  reduced fee at time of service.  [facility] Department of [facility]  [address], [place] [phone] Accepts children up to age 39 who are enrolled in [place] or [place] Health Choice; pregnant women with a Medicaid card; and children who have applied for Medicaid or  Health Choice, but were declined, whose parents can pay a reduced fee at time of service.  [facility]  [address] [place], [place] [phone] Patients are seen by appointment only. Walk-ins are not accepted. [facility] will see patients 35 years of age and older. Monday - Tuesday (8am-5pm) Most Wednesdays (8:30-5pm) $30 per visit, cash only  [facility]  [address], [facility] [phone] Patients are seen by appointment only. Walk-ins are not accepted. [facility] will see patients 35 years of age and older. One Wednesday Evening (Monthly: Volunteer Based).  $30 per visit, cash only  [employer] [facility]  [phone] for adults; Children under age 404, call Graduate Pediatric Dentistry at [phone]. Children aged 744-14, please call [phone] to request a pediatric application.  Dental services are provided in all areas of dental care including fillings, crowns and bridges, complete and partial dentures, implants, gum treatment, root canals, and extractions. Preventive care is also provided. Treatment is provided to both adults and children. Patients are selected via a lottery and there is often a waiting list.   [facility] [address], [place]  [phone] [email or web address]   [facility] [address], [place], [place] [phone], Ext. [phone] Second and Fourth Thursday of each month, opens at 6:30 AM; Clinic ends at 9 AM.  Patients are seen on a first-come first-served basis, and a limited number are seen during each clinic.   [facility]  [address], [place], [place] [phone]   Eligibility Requirements You must have lived in [place], [place], or [place] counties for at least the last three months.   You cannot be eligible for state or federal sponsored [place]healthcare insurance, including CIGNAVeterans Administration, [place]icaid, or [employer]icare.   You generally cannot be eligible for healthcare insurance through your employer.    How to apply: Eligibility screenings are held every Tuesday and Wednesday afternoon from 1:00 pm until 4:00 pm. You do not need an appointment for the interview!  [facility] [address], [place], [place] [phone]   [facility]  [phone]   [facility] [facility]  [phone]   [facility]  [phone]    Behavioral Health Resources in the Community: Intensive Outpatient Programs Organization         Address  Phone  Notes  [facility] [address], [place], [place] [phone]   [facility] [address], [place], [place] [phone]   [facility] [address], [place], [place]  [phone]   [facility] [address],  [place], [place] [phone] or [phone]   Substance Abuse Resources Organization         Address  Phone  Notes  [facility]  [phone]   [facility]  [phone]   [facility]  [phone]   [facility]  [phone]   Residential & Outpatient Substance Abuse Program  [phone]   Psychological Services Organization         Address  Phone  Notes  [facility]  [phone]- [phone]   [facility]  [phone]- [phone]   [facility] [address], [place] [phone] or [phone]    Mobile Crisis Teams Organization         Address  Phone  Notes  [facility], Mobile Crisis Care Unit  [phone]    [facility]  [address] [place], [place] [phone]   [facility] [address] [place] [phone]    Self-Help/Support Groups Organization  Address  Phone             Notes  [place] [place] - variety of support groups  Boyce Call for more information  Narcotics Anonymous (NA), [facility] [address], [employer] [place]  2 meetings at this location   Special educational needs teacher         Address  Phone  Notes  [facility] [place],    [place]  [phone]   [facility]  [address], [place] [phone], [place], [place]   [place], [place] [phone] Admissions: 8am-3pm M-F  Incentives Substance [place] [address]    [place], [place] [phone]   [facility] [address] [place], [place], [place]   [facility] [address]  [place], [place]   [facility] - Intensive Outpatient [address] Kristeen [address], [place], [place]   [facility] ([place] [place].,  [place], [place] [phone] or [phone]   [facility] ([facility]) [address] [place], [place] Accepts Medicaid  [facility] [address],  [place] [phone] Substance Abuse/Addiction Treatment   [facility] Organization         Address  Phone  Notes  [facility]  [phone]   Domenic Schwab, PhD [address] Arlis Porta [place], [place]   [phone] or [phone]   [place] [facility] [place], [place] [phone]   [facility] [address] [place], [place] [phone] Insurance/Medicaid/sponsorship through [facility] and Families [address] [place]                                     [place], [place] [phone] [place] [address] Drive[place], [place] [phone]    Dr. Adele Schilder  [phone]   [facility] Dept. 1) [address] [place] 2) [place] 3)  [place] [address], [place] [phone]  [phone]   [place] [phone] or [phone] (After Hours)

## 2015-05-16 NOTE — ED Notes (Signed)
Declined W/C at D/C and was escorted to lobby by RN. 

## 2015-05-16 NOTE — ED Notes (Signed)
Pt had abscess drained and packed on wed.  Was told to return today for wound re-check.  States packing came out.

## 2015-05-16 NOTE — ED Provider Notes (Signed)
CSN: [id number]     Arrival date & time 05/16/15  1418 History  This chart was scribed for Travis Mutton, PA-C, working with Donnetta Hutching, MD by Chestine Spore, ED Scribe. The patient was seen in room TR11C/TR11C at 3:23 PM    Chief Complaint  Patient presents with  . Wound Check      The history is provided by the patient. No language interpreter was used.     HPI Comments: Travis Soto is a 34 y.o. male with a PMHx of asthma who presents to the Emergency Department complaining of abscess onset 05/10/2015. Pt was seen on 05/13/2015 for an abscess localized to the left buttocks that the pt had I&D. Pt reports that he had some drainage yesterday and the packing fell out. Pt notes that the area has not gotten larger. Pt reports that the pain is worsened with ambulation. Pt reports that he has not taken his abx because of the price, and he has taken abx that he was given 1-2 months ago. Denies fevers, red streaking, nausea, abdominal pain, back pain, and warmth. PCP none   Past Medical History  Diagnosis Date  . Asthma    Past Surgical History  Procedure Laterality Date  . Shoulder surgery Left 07/10/2012   No family history on file. History  Substance Use Topics  . Smoking status: Former Smoker    Types: Cigarettes  . Smokeless tobacco: Not on file  . Alcohol Use: No    Review of Systems  Constitutional: Negative for fever.  Gastrointestinal: Negative for nausea and abdominal pain.  Musculoskeletal: Negative for back pain.      Allergies  Mushroom extract complex; Tomato; and Food  Home Medications   Prior to Admission medications   Medication Sig Start Date End Date Taking? Authorizing Provider  acetaminophen (TYLENOL) 500 MG tablet Take 1,000 mg by mouth every 6 (six) hours as needed.    Historical Provider, MD  aspirin-acetaminophen-caffeine (EXCEDRIN MIGRAINE) [phone] MG per tablet Take 2 tablets by mouth every 8 (eight) hours as needed (for tooth pain).      Historical Provider, MD  cephALEXin (KEFLEX) 250 MG capsule Take 1 capsule (250 mg total) by mouth 4 (four) times daily. 05/16/15   Nicklos Gaxiola, PA-C  clindamycin (CLEOCIN) 150 MG capsule Take 2 capsules (300 mg total) by mouth 3 (three) times daily. 03/13/14   Elpidio Anis, PA-C  oxyCODONE-acetaminophen (PERCOCET/ROXICET) 5-325 MG per tablet Take 1-2 tablets by mouth every 4 (four) hours as needed for severe pain. 03/13/14   Elpidio Anis, PA-C  sulfamethoxazole-trimethoprim (BACTRIM DS,SEPTRA DS) 800-160 MG per tablet Take 1 tablet by mouth 2 (two) times daily. 05/16/15 05/23/15  Amritpal Shropshire, PA-C   BP 102/56 mmHg  Pulse 71  Temp(Src) 98 F (36.7 C) (Oral)  Resp 18  SpO2 100% Physical Exam  Constitutional: He is oriented to person, place, and time. He appears well-developed and well-nourished. No distress.  HENT:  Head: Normocephalic and atraumatic.  Eyes: Conjunctivae and EOM are normal. Right eye exhibits no discharge. Left eye exhibits no discharge.  Neck: Normal range of motion. Neck supple.  Cardiovascular: Normal rate, regular rhythm and normal heart sounds.  Exam reveals no friction rub.   No murmur heard. Pulses:      Radial pulses are 2+ on the right side, and 2+ on the left side.  Pulmonary/Chest: Effort normal and breath sounds normal. No respiratory distress. He has no wheezes. He has no rales.  Genitourinary:     Musculoskeletal:  Normal range of motion.  Neurological: He is alert and oriented to person, place, and time. No cranial nerve deficit. He exhibits normal muscle tone. Coordination normal. GCS eye subscore is 4. GCS verbal subscore is 5. GCS motor subscore is 6.  Skin: No rash noted. He is not diaphoretic. No erythema.  Nursing note and vitals reviewed.   ED Course  Procedures (including critical care time) DIAGNOSTIC STUDIES: Oxygen Saturation is 96% on RA, nl by my interpretation.    COORDINATION OF CARE: 3:29 PM-Discussed treatment plan which  includes consult to case worker, continue to take Rx abx with pt at bedside and pt agreed to plan.   Labs Review Labs Reviewed - No data to display  Imaging Review No results found.   EKG Interpretation None       3:28 PM This provider spoke with Burna MortimerWanda, Case Management - discussed inability of patient to get antibiotics. Reported that [facility] can get them at [facility]  3:37 PM Patient seen and assessed by attending physician, Dr. Shelly CossB. Cook - agreed to plan of discharge and for patient to take antibiotics.   MDM   Final diagnoses:  Wound check, abscess    Medications - No data to display  Filed Vitals:   05/16/15 1426 05/16/15 1428 05/16/15 1600  BP:  111/66 102/56  Pulse: 95  71  Temp: 98 F (36.7 C)    TempSrc: Oral    Resp: 16  18  SpO2: 96%  100%   I personally performed the services described in this documentation, which was scribed in my presence. The recorded information has been reviewed and is accurate.   This provider reviewed patient's chart. Patient was seen and assessed in ED setting on 05/13/2015 regarding abscess. CT pelvis with contrast was performed with negative findings of rectal abscess. Incision and drainage performed by Dr. Clarice PolePfeifer patient was discharged with antibiotics. Patient reports that he has not been taking his antibiotics secondary to monetary issues. Patient seen in the ED today for reassessment. Incision identified near the right buttock at a proximally 3 o'clock with clean margins. Negative active drainage or bleeding noted. Negative erythema or surrounding inflammation or swelling. Negative edge streaks. Negative signs of cellulitic infection. Tenderness upon palpation. Wound probed with hemostat with negative active drainage of pus. Patient seen and assessed by attending physician, Dr. Adriana Simasook, who agrees to plan of discharge-reported that the wound is healing well. Recommended antibiotics to be started and for patient to perform warm soaks.  Antibiotics prescriptions reprinted secondary [facility] secondary to monetary issues. This provider spoke with social worker, Burna MortimerWanda, reported that patient can get antibiotics at [facility]. Patient stable, afebrile. Patient not septic appearing. Negative signs of respiratory distress. Discharged patient. Discussed with patient to take antibiotics and to perform warm soaks. Discussed with patient to have site reassessed within approximately 48 hours again. Referred patient to general surgery [facility] center. Discussed with patient to closely monitor symptoms and if symptoms are to worsen or change to report back to the ED - strict return instructions given.  Patient agreed to plan of care, understood, all questions answered.   Travis MuttonMarissa Ajaya Crutchfield, PA-C 05/16/15 1625  Donnetta HutchingBrian Cook, MD 05/16/15 [phone]

## 2015-07-14 ENCOUNTER — Emergency Department (HOSPITAL_COMMUNITY)
Admission: EM | Admit: 2015-07-14 | Discharge: 2015-07-14 | Disposition: A | Discharge status: Home / Self Care | Payer: Self-pay | Attending: Emergency Medicine | Admitting: Emergency Medicine

## 2015-07-14 ENCOUNTER — Emergency Department (HOSPITAL_COMMUNITY): Payer: Self-pay

## 2015-07-14 ENCOUNTER — Encounter (HOSPITAL_COMMUNITY): Payer: Self-pay | Admitting: Emergency Medicine

## 2015-07-14 DIAGNOSIS — Y998 Other external cause status: Secondary | ICD-10-CM | POA: Insufficient documentation

## 2015-07-14 DIAGNOSIS — Z87891 Personal history of nicotine dependence: Secondary | ICD-10-CM | POA: Insufficient documentation

## 2015-07-14 DIAGNOSIS — J45909 Unspecified asthma, uncomplicated: Secondary | ICD-10-CM | POA: Insufficient documentation

## 2015-07-14 DIAGNOSIS — Y9389 Activity, other specified: Secondary | ICD-10-CM | POA: Insufficient documentation

## 2015-07-14 DIAGNOSIS — S8012XA Contusion of left lower leg, initial encounter: Secondary | ICD-10-CM | POA: Insufficient documentation

## 2015-07-14 DIAGNOSIS — Z792 Long term (current) use of antibiotics: Secondary | ICD-10-CM | POA: Insufficient documentation

## 2015-07-14 DIAGNOSIS — W2209XA Striking against other stationary object, initial encounter: Secondary | ICD-10-CM | POA: Insufficient documentation

## 2015-07-14 DIAGNOSIS — Y9289 Other specified places as the place of occurrence of the external cause: Secondary | ICD-10-CM | POA: Insufficient documentation

## 2015-07-14 DIAGNOSIS — Z79899 Other long term (current) drug therapy: Secondary | ICD-10-CM | POA: Insufficient documentation

## 2015-07-14 MED ORDER — OXYCODONE-ACETAMINOPHEN 5-325 MG PO TABS
ORAL_TABLET | ORAL | Status: DC
Start: 2015-07-14 — End: 2015-07-14
  Filled 2015-07-14: qty 1

## 2015-07-14 MED ORDER — IBUPROFEN 800 MG PO TABS: 800.0000 mg | ORAL_TABLET | Freq: Three times a day (TID) | ORAL | Status: DC

## 2015-07-14 MED ORDER — OXYCODONE-ACETAMINOPHEN 5-325 MG PO TABS
1.0000 | ORAL_TABLET | Freq: Once | ORAL | Status: AC
  Administered 2015-07-14: 1 via ORAL

## 2015-07-14 MED ORDER — HYDROCODONE-ACETAMINOPHEN 5-325 MG PO TABS: 2.0000 | ORAL_TABLET | ORAL | Status: DC | PRN

## 2015-07-14 NOTE — ED Provider Notes (Signed)
CSN: [id number]     Arrival date & time 07/14/15  1619 History   This chart was scribed for Langston Masker, PA-C working with Mancel Bale, MD by Elveria Rising, ED Scribe. This patient was seen in room TR11C/TR11C and the patient's care was started at 6:10 PM.   Chief Complaint  Patient presents with  . Leg Pain   The history is provided by the patient. No language interpreter was used.   HPI Comments: Travis Soto is a 34 y.o. male who presents to the Emergency Department complaining of left lower leg pain after striking leg on wooden fence this morning Patient locates pain and swelling to medial aspect of lower leg. Patient reports that he has unable to ambulate due to pain severity.   Past Medical History  Diagnosis Date  . Asthma    Past Surgical History  Procedure Laterality Date  . Shoulder surgery Left 07/10/2012  . Shoulder arthroscopy     No family history on file. History  Substance Use Topics  . Smoking status: Former Smoker    Types: Cigarettes  . Smokeless tobacco: Not on file  . Alcohol Use: No    Review of Systems  Constitutional: Negative for fever.  Musculoskeletal: Positive for arthralgias.  Skin: Positive for color change. Negative for wound.  Neurological: Negative for weakness and numbness.  All other systems reviewed and are negative.   Allergies  Mushroom extract complex; Tomato; and Food  Home Medications   Prior to Admission medications   Medication Sig Start Date End Date Taking? Authorizing Provider  acetaminophen (TYLENOL) 500 MG tablet Take 1,000 mg by mouth every 6 (six) hours as needed.    Historical Provider, MD  aspirin-acetaminophen-caffeine (EXCEDRIN MIGRAINE) [phone] MG per tablet Take 2 tablets by mouth every 8 (eight) hours as needed (for tooth pain).     Historical Provider, MD  cephALEXin (KEFLEX) 250 MG capsule Take 1 capsule (250 mg total) by mouth 4 (four) times daily. 05/16/15   Marissa Sciacca, PA-C  clindamycin (CLEOCIN)  150 MG capsule Take 2 capsules (300 mg total) by mouth 3 (three) times daily. 03/13/14   Elpidio Anis, PA-C  oxyCODONE-acetaminophen (PERCOCET/ROXICET) 5-325 MG per tablet Take 1-2 tablets by mouth every 4 (four) hours as needed for severe pain. 03/13/14   Elpidio Anis, PA-C   Triage Vitals: BP 110/71 mmHg  Pulse 84  Temp(Src) 98.7 F (37.1 C) (Oral)  Resp 20  SpO2 96% Physical Exam  Constitutional: He is oriented to person, place, and time. He appears well-developed and well-nourished. No distress.  HENT:  Head: Normocephalic and atraumatic.  Eyes: EOM are normal.  Neck: Neck supple. No tracheal deviation present.  Cardiovascular: Normal rate.   Pulmonary/Chest: Effort normal. No respiratory distress.  Musculoskeletal: Normal range of motion. He exhibits tenderness.  Bruised swollen left lower leg.   Neurological: He is alert and oriented to person, place, and time.  Skin: Skin is warm and dry.  Psychiatric: He has a normal mood and affect. His behavior is normal.  Nursing note and vitals reviewed.   ED Course  Procedures (including critical care time)  COORDINATION OF CARE: 6:13 PM- Patient will be referred to ortho. He is advised to treat injury with compression, ice and elevation. Patient requests a work note. Discussed treatment plan with patient at bedside and patient agreed to plan.   Labs Review Labs Reviewed - No data to display  Imaging Review Dg Tibia/fibula Left  07/14/2015   CLINICAL DATA:  Left lower  leg injury and pain today. Initial encounter.  EXAM: LEFT TIBIA AND FIBULA - 2 VIEW  COMPARISON:  None.  FINDINGS: There is no evidence of fracture, subluxation or dislocation.  No focal bony lesions are identified.  Medial soft tissue swelling is noted.  No radiopaque foreign bodies are noted.  IMPRESSION: Soft tissue swelling without bony abnormality.   Electronically Signed   By: Harmon Pier M.D.   On: 07/14/2015 17:38     EKG Interpretation None      MDM    Final diagnoses:  Contusion of left lower leg, initial encounter    Hydrocodone Ibuprofen See the Orthopaedist for evaluation   [name], PA-C 07/14/15 1822  Mancel Bale, MD 07/15/15 [phone]

## 2015-07-14 NOTE — Discharge Instructions (Signed)
Contusion °A contusion is a deep bruise. Contusions are the result of an injury that caused bleeding under the skin. The contusion may turn blue, purple, or yellow. Minor injuries will give you a painless contusion, but more severe contusions may stay painful and swollen for a few weeks.  °CAUSES  °A contusion is usually caused by a blow, trauma, or direct force to an area of the body. °SYMPTOMS  °· Swelling and redness of the injured area. °· Bruising of the injured area. °· Tenderness and soreness of the injured area. °· Pain. °DIAGNOSIS  °The diagnosis can be made by taking a history and physical exam. An X-ray, CT scan, or MRI may be needed to determine if there were any associated injuries, such as fractures. °TREATMENT  °Specific treatment will depend on what area of the body was injured. In general, the best treatment for a contusion is resting, icing, elevating, and applying cold compresses to the injured area. Over-the-counter medicines may also be recommended for pain control. Ask your caregiver what the best treatment is for your contusion. °HOME CARE INSTRUCTIONS  °· Put ice on the injured area. °¨ Put ice in a plastic bag. °¨ Place a towel between your skin and the bag. °¨ Leave the ice on for 15-20 minutes, 3-4 times a day, or as directed by your health care provider. °· Only take over-the-counter or prescription medicines for pain, discomfort, or fever as directed by your caregiver. Your caregiver may recommend avoiding anti-inflammatory medicines (aspirin, ibuprofen, and naproxen) for 48 hours because these medicines may increase bruising. °· Rest the injured area. °· If possible, elevate the injured area to reduce swelling. °SEEK IMMEDIATE MEDICAL CARE IF:  °· You have increased bruising or swelling. °· You have pain that is getting worse. °· Your swelling or pain is not relieved with medicines. °MAKE SURE YOU:  °· Understand these instructions. °· Will watch your condition. °· Will get help right  away if you are not doing well or get worse. °Document Released: 09/14/2005 Document Revised: 12/10/2013 Document Reviewed: 10/10/2011 °ExitCare® Patient Information ©2015 [employer]. This information is not intended to replace advice given to you by your health care provider. Make sure you discuss any questions you have with your health care provider. ° °

## 2015-07-14 NOTE — ED Notes (Signed)
Hit left lower on wooden fence, on medial tib/fib area-- reddened swollen-- states when walking -- area swells more. Pain 10/10.

## 2015-10-27 ENCOUNTER — Encounter (HOSPITAL_COMMUNITY): Payer: Self-pay | Admitting: Emergency Medicine

## 2015-10-27 ENCOUNTER — Emergency Department (HOSPITAL_COMMUNITY)
Admission: EM | Admit: 2015-10-27 | Discharge: 2015-10-27 | Disposition: A | Discharge status: Home / Self Care | Payer: Self-pay | Attending: Emergency Medicine | Admitting: Emergency Medicine

## 2015-10-27 DIAGNOSIS — Z791 Long term (current) use of non-steroidal anti-inflammatories (NSAID): Secondary | ICD-10-CM | POA: Insufficient documentation

## 2015-10-27 DIAGNOSIS — Z792 Long term (current) use of antibiotics: Secondary | ICD-10-CM | POA: Insufficient documentation

## 2015-10-27 DIAGNOSIS — J45909 Unspecified asthma, uncomplicated: Secondary | ICD-10-CM | POA: Insufficient documentation

## 2015-10-27 DIAGNOSIS — H6091 Unspecified otitis externa, right ear: Secondary | ICD-10-CM

## 2015-10-27 DIAGNOSIS — Z720 Tobacco use: Secondary | ICD-10-CM | POA: Insufficient documentation

## 2015-10-27 MED ORDER — TRAMADOL HCL 50 MG PO TABS: 50.0000 mg | ORAL_TABLET | Freq: Four times a day (QID) | ORAL | Status: DC | PRN

## 2015-10-27 MED ORDER — CIPROFLOXACIN-DEXAMETHASONE 0.3-0.1 % OT SUSP
4.0000 [drp] | Freq: Two times a day (BID) | OTIC | Status: AC
Start: 2015-10-27 — End: 2015-11-02

## 2015-10-27 MED ORDER — OXYCODONE-ACETAMINOPHEN 5-325 MG PO TABS
ORAL_TABLET | ORAL | Status: AC
  Filled 2015-10-27: qty 1

## 2015-10-27 MED ORDER — OXYCODONE-ACETAMINOPHEN 5-325 MG PO TABS
1.0000 | ORAL_TABLET | Freq: Once | ORAL | Status: AC
  Administered 2015-10-27: 1 via ORAL

## 2015-10-27 NOTE — Discharge Instructions (Signed)
Ear Drops, Adult °You have been diagnosed with a condition requiring you to put drops of medicine into your outer ear. °HOME CARE INSTRUCTIONS  °· Put drops in the affected ear as instructed. After putting the drops in, you will need to lie down with the affected ear facing up for ten minutes so the drops will remain in the ear canal and run down and fill the canal. Continue using the ear drops for as long as directed by your health care provider. °· Prior to getting up, put a cotton ball gently in your ear canal. Leave enough of the cotton ball out so it can be easily removed. Do not attempt to push this down into the canal with a cotton-tipped swab or other instrument. °· Do not irrigate or wash out your ears if you have had a perforated eardrum or mastoid surgery, or unless instructed to do so by your health care provider. °· Keep appointments with your health care provider as instructed. °· Finish all medicine, or use for the length of time prescribed by your health care provider. Continue the drops even if your problem seems to be doing well after a couple days, or continue as instructed. °SEEK MEDICAL CARE IF: °· You become worse or develop increasing pain. °· You notice any unusual drainage from your ear (particularly if the drainage has a bad smell). °· You develop hearing difficulties. °· You experience a serious form of dizziness in which you feel as if the room is spinning, and you feel nauseated (vertigo). °· The outside of your ear becomes red or swollen or both. This may be a sign of an allergic reaction. °MAKE SURE YOU:  °· Understand these instructions. °· Will watch your condition. °· Will get help right away if you are not doing well or get worse. °  °This information is not intended to replace advice given to you by your health care provider. Make sure you discuss any questions you have with your health care provider. °  °Document Released: 11/29/2001 Document Revised: 12/26/2014 Document  Reviewed: 07/02/2013 °Elsevier Interactive Patient Education ©2016 [employer] ° °Otitis Externa °Otitis externa is a bacterial or fungal infection of the outer ear canal. This is the area from the eardrum to the outside of the ear. Otitis externa is sometimes called "swimmer's ear." °CAUSES  °Possible causes of infection include: °· Swimming in dirty water. °· Moisture remaining in the ear after swimming or bathing. °· Mild injury (trauma) to the ear. °· Objects stuck in the ear (foreign body). °· Cuts or scrapes (abrasions) on the outside of the ear. °SIGNS AND SYMPTOMS  °The first symptom of infection is often itching in the ear canal. Later signs and symptoms may include swelling and redness of the ear canal, ear pain, and yellowish-white fluid (pus) coming from the ear. The ear pain may be worse when pulling on the earlobe. °DIAGNOSIS  °Your health care provider will perform a physical exam. A sample of fluid may be taken from the ear and examined for bacteria or fungi. °TREATMENT  °Antibiotic ear drops are often given for 10 to 14 days. Treatment may also include pain medicine or corticosteroids to reduce itching and swelling. °HOME CARE INSTRUCTIONS  °· Apply antibiotic ear drops to the ear canal as prescribed by your health care provider. °· Take medicines only as directed by your health care provider. °· If you have diabetes, follow any additional treatment instructions from your health care provider. °· Keep all follow-up visits   as directed by your health care provider. °PREVENTION  °· Keep your ear dry. Use the corner of a towel to absorb water out of the ear canal after swimming or bathing. °· Avoid scratching or putting objects inside your ear. This can damage the ear canal or remove the protective wax that lines the canal. This makes it easier for bacteria and fungi to grow. °· Avoid swimming in lakes, polluted water, or poorly chlorinated pools. °· You may use ear drops made of rubbing alcohol and  vinegar after swimming. Combine equal parts of white vinegar and alcohol in a bottle. Put 3 or 4 drops into each ear after swimming. °SEEK MEDICAL CARE IF:  °· You have a fever. °· Your ear is still red, swollen, painful, or draining pus after 3 days. °· Your redness, swelling, or pain gets worse. °· You have a severe headache. °· You have redness, swelling, pain, or tenderness in the area behind your ear. °MAKE SURE YOU:  °· Understand these instructions. °· Will watch your condition. °· Will get help right away if you are not doing well or get worse. °  °This information is not intended to replace advice given to you by your health care provider. Make sure you discuss any questions you have with your health care provider. °  °Document Released: 12/05/2005 Document Revised: 12/26/2014 Document Reviewed: 12/22/2011 °Elsevier Interactive Patient Education ©2016 Elsevier Inc. ° °

## 2015-10-27 NOTE — ED Notes (Signed)
C/O R ear pain and occipital HA. Pt reports having temp at home of 100.6. Pt took motrin around 2330 last night. No fever at present. Pain 9/10. No n/v/d. Pt a&o NAD.

## 2015-10-27 NOTE — ED Provider Notes (Signed)
CSN: [id number]     Arrival date & time 10/27/15  0400 History   First MD Initiated Contact with Patient 10/27/15 0455     Chief Complaint  Patient presents with  . Otalgia  . Headache     (Consider location/radiation/quality/duration/timing/severity/associated sxs/prior Treatment) HPI   PCP: No PCP Per Patient PMH: asthma  Travis Soto is a 34 y.o.  male  CHIEF COMPLAINT:  Right ear pain  Time of onset: 5: 30 pm on 10/27/2015        Quality:  Sharp. aching        Severity:  Severe 9/10        Progression:  worsening Chronicity: new onset Risk factors: none identified Treatments tried:  Percocet given in fast track, significant relief obtained Relieved by: none Worsened by: touching the ear. Associated Symptoms:  Pain, low grade temp  ROS: The patient denies diaphoresis, fever, headache, weakness (general or focal), confusion, change of vision,  dysphagia, aphagia, shortness of breath,  abdominal pains, nausea, vomiting, diarrhea, lower extremity swelling, rash, neck pain, chest pain   Past Medical History  Diagnosis Date  . Asthma    Past Surgical History  Procedure Laterality Date  . Shoulder surgery Left 07/10/2012  . Shoulder arthroscopy     No family history on file. Social History  Substance Use Topics  . Smoking status: Current Every Day Smoker    Types: Cigarettes  . Smokeless tobacco: None  . Alcohol Use: No    Review of Systems  ROS: See HPI Constitutional: no fever  Eyes: no drainage  ENT: no runny nose  Cardiovascular: no chest pain  Resp: no SOB  GI: no vomiting GU: no dysuria Integumentary: no rash  Allergy: no hives  Musculoskeletal: no leg swelling  Neurological: no slurred speech ROS otherwise negative   Allergies  Mushroom extract complex; Tomato; and Food  Home Medications   Prior to Admission medications   Medication Sig Start Date End Date Taking? Authorizing Provider  acetaminophen (TYLENOL) 500 MG tablet Take  1,000 mg by mouth every 6 (six) hours as needed.    Historical Provider, MD  aspirin-acetaminophen-caffeine (EXCEDRIN MIGRAINE) [phone] MG per tablet Take 2 tablets by mouth every 8 (eight) hours as needed (for tooth pain).     Historical Provider, MD  cephALEXin (KEFLEX) 250 MG capsule Take 1 capsule (250 mg total) by mouth 4 (four) times daily. 05/16/15   Marissa Sciacca, PA-C  ciprofloxacin-dexamethasone (CIPRODEX) otic suspension Place 4 drops into the right ear 2 (two) times daily. 10/27/15 11/02/15  Jehan Bonano Neva Seat, PA-C  clindamycin (CLEOCIN) 150 MG capsule Take 2 capsules (300 mg total) by mouth 3 (three) times daily. 03/13/14   Elpidio Anis, PA-C  HYDROcodone-acetaminophen (NORCO/VICODIN) 5-325 MG per tablet Take 2 tablets by mouth every 4 (four) hours as needed. 07/14/15   [name], PA-C  ibuprofen (ADVIL,MOTRIN) 800 MG tablet Take 1 tablet (800 mg total) by mouth 3 (three) times daily. 07/14/15   [name], PA-C  oxyCODONE-acetaminophen (PERCOCET/ROXICET) 5-325 MG per tablet Take 1-2 tablets by mouth every 4 (four) hours as needed for severe pain. 03/13/14   Elpidio Anis, PA-C  traMADol (ULTRAM) 50 MG tablet Take 1 tablet (50 mg total) by mouth every 6 (six) hours as needed. 10/27/15   Gershom Brobeck Neva Seat, PA-C   BP 111/61 mmHg  Pulse 83  Temp(Src) 98.8 F (37.1 C) (Oral)  Resp 17  Ht 6' (1.829 m)  Wt 209 lb (94.802 kg)  BMI 28.34 kg/m2  SpO2 95% Physical Exam  Constitutional: He appears well-developed and well-nourished. No distress.  HENT:  Head: Normocephalic and atraumatic.  Right Ear: There is tenderness. Tympanic membrane is injected.  Erythematous ear canal with small amount of drainage within the right ear canal, no fluid or perforation noted to TM which is well visualized  Eyes: Pupils are equal, round, and reactive to light.  Neck: Normal range of motion. Neck supple.  Cardiovascular: Normal rate and regular rhythm.   Pulmonary/Chest: Effort normal.  Abdominal:  Soft.  Neurological: He is alert.  Skin: Skin is warm and dry.  Nursing note and vitals reviewed.   ED Course  Procedures (including critical care time) Labs Review Labs Reviewed - No data to display  Imaging Review No results found. I have personally reviewed and evaluated these images and lab results as part of my medical decision-making.   EKG Interpretation None      MDM   Final diagnoses:  Otitis externa, right    rx Ciprodex, f/u with ENT.  Medications  oxyCODONE-acetaminophen (PERCOCET/ROXICET) 5-325 MG per tablet 1 tablet (1 tablet Oral Given 10/27/15 0417)    34 y.o.Murrell Zuniga's medical screening exam was performed and I feel the patient has had an appropriate workup for their chief complaint at this time and likelihood of emergent condition existing is low. They have been counseled on decision, discharge, follow up and which symptoms necessitate immediate return to the emergency department. They or their family verbally stated understanding and agreement with plan and discharged in stable condition.   Vital signs are stable at discharge. Filed Vitals:   10/27/15 0534  BP: 111/61  Pulse:   Temp:   Resp:       Marlon Peliffany Breeonna Mone, PA-C 10/28/15 0442  Zadie Rhineonald Wickline, MD 10/28/15 1241

## 2015-10-29 ENCOUNTER — Emergency Department (HOSPITAL_COMMUNITY)
Admission: EM | Admit: 2015-10-29 | Discharge: 2015-10-29 | Disposition: A | Discharge status: Home / Self Care | Payer: Self-pay | Attending: Emergency Medicine | Admitting: Emergency Medicine

## 2015-10-29 ENCOUNTER — Encounter (HOSPITAL_COMMUNITY): Payer: Self-pay | Admitting: Emergency Medicine

## 2015-10-29 DIAGNOSIS — Z7952 Long term (current) use of systemic steroids: Secondary | ICD-10-CM | POA: Insufficient documentation

## 2015-10-29 DIAGNOSIS — J45909 Unspecified asthma, uncomplicated: Secondary | ICD-10-CM | POA: Insufficient documentation

## 2015-10-29 DIAGNOSIS — H6691 Otitis media, unspecified, right ear: Secondary | ICD-10-CM | POA: Insufficient documentation

## 2015-10-29 DIAGNOSIS — Z792 Long term (current) use of antibiotics: Secondary | ICD-10-CM | POA: Insufficient documentation

## 2015-10-29 DIAGNOSIS — H6091 Unspecified otitis externa, right ear: Secondary | ICD-10-CM | POA: Insufficient documentation

## 2015-10-29 DIAGNOSIS — Z791 Long term (current) use of non-steroidal anti-inflammatories (NSAID): Secondary | ICD-10-CM | POA: Insufficient documentation

## 2015-10-29 DIAGNOSIS — Z720 Tobacco use: Secondary | ICD-10-CM | POA: Insufficient documentation

## 2015-10-29 MED ORDER — AMOXICILLIN 500 MG PO CAPS: 500.0000 mg | ORAL_CAPSULE | Freq: Two times a day (BID) | ORAL | Status: DC

## 2015-10-29 MED ORDER — TRAMADOL HCL 50 MG PO TABS: 50.0000 mg | ORAL_TABLET | Freq: Four times a day (QID) | ORAL | Status: DC | PRN

## 2015-10-29 MED ORDER — CIPROFLOXACIN-DEXAMETHASONE 0.3-0.1 % OT SUSP: 4.0000 [drp] | Freq: Two times a day (BID) | OTIC | Status: DC

## 2015-10-29 MED ORDER — AMOXICILLIN-POT CLAVULANATE 875-125 MG PO TABS
1.0000 | ORAL_TABLET | Freq: Once | ORAL | Status: AC
  Administered 2015-10-29: 1 via ORAL
  Filled 2015-10-29: qty 1

## 2015-10-29 MED ORDER — IBUPROFEN 200 MG PO TABS
400.0000 mg | ORAL_TABLET | Freq: Once | ORAL | Status: AC
  Administered 2015-10-29: 400 mg via ORAL
  Filled 2015-10-29: qty 2

## 2015-10-29 MED ORDER — OXYCODONE-ACETAMINOPHEN 5-325 MG PO TABS
2.0000 | ORAL_TABLET | Freq: Once | ORAL | Status: AC
  Administered 2015-10-29: 2 via ORAL
  Filled 2015-10-29 (×2): qty 2

## 2015-10-29 MED ORDER — CIPROFLOXACIN-DEXAMETHASONE 0.3-0.1 % OT SUSP
4.0000 [drp] | Freq: Two times a day (BID) | OTIC | Status: AC
  Administered 2015-10-29: 4 [drp] via OTIC
  Filled 2015-10-29: qty 7.5

## 2015-10-29 NOTE — ED Provider Notes (Signed)
History  By signing my name below, I, Karle PlumberJennifer Tensley, attest that this documentation has been prepared under the direction and in the presence of [employer] AutomotiveKelly Vasilis Luhman, PA-C. Electronically Signed: Karle PlumberJennifer Tensley, ED Scribe. 10/29/2015. 8:50 PM.  Chief Complaint  Patient presents with  . otalgia    The history is provided by the patient and medical records. No language interpreter was used.    HPI Comments:  Travis Soto is a 34 y.o. male who presents to the Emergency Department complaining of worsening severe right ear pain that began two days ago. Pt states he was seen at [facility] two days ago, diagnosed with otitis externa and was prescribed Ciprodex and Tramadol. Pt states he cannot get the Ciprodex because it is too expensive. He reports associated difficulty hearing from the right ear and orange-colored drainage noted to the cotton he had placed in the canal yesterday. He reports fever Tmax 102.5 degrees orally PTA. He has not taken anything for treatment of the fever and was afebrile on ED presentation. Chewing or touching the right ear increases the pain. He denies alleviating factors. He denies any recent swimming. He denies sore throat, trouble swallowing, nasal congestion, or bleeding from the ear. He denies using Q-tips.   Past Medical History  Diagnosis Date  . Asthma    Past Surgical History  Procedure Laterality Date  . Shoulder surgery Left 07/10/2012  . Shoulder arthroscopy     No family history on file. Social History  Substance Use Topics  . Smoking status: Current Every Day Smoker    Types: Cigarettes  . Smokeless tobacco: None  . Alcohol Use: No    Review of Systems  Constitutional: Positive for fever.  HENT: Positive for ear pain and hearing loss.   All other systems reviewed and are negative.   Allergies  Mushroom extract complex; Tomato; and Food  Home Medications   Prior to Admission medications   Medication Sig Start Date End Date Taking? Authorizing  Provider  acetaminophen (TYLENOL) 500 MG tablet Take 1,000 mg by mouth every 6 (six) hours as needed.    Historical Provider, MD  aspirin-acetaminophen-caffeine (EXCEDRIN MIGRAINE) [phone] MG per tablet Take 2 tablets by mouth every 8 (eight) hours as needed (for tooth pain).     Historical Provider, MD  cephALEXin (KEFLEX) 250 MG capsule Take 1 capsule (250 mg total) by mouth 4 (four) times daily. 05/16/15   Marissa Sciacca, PA-C  ciprofloxacin-dexamethasone (CIPRODEX) otic suspension Place 4 drops into the right ear 2 (two) times daily. 10/27/15 11/02/15  Tiffany Neva SeatGreene, PA-C  clindamycin (CLEOCIN) 150 MG capsule Take 2 capsules (300 mg total) by mouth 3 (three) times daily. 03/13/14   Elpidio AnisShari Upstill, PA-C  HYDROcodone-acetaminophen (NORCO/VICODIN) 5-325 MG per tablet Take 2 tablets by mouth every 4 (four) hours as needed. 07/14/15   Elson AreasLeslie K Sofia, PA-C  ibuprofen (ADVIL,MOTRIN) 800 MG tablet Take 1 tablet (800 mg total) by mouth 3 (three) times daily. 07/14/15   Elson AreasLeslie K Sofia, PA-C  oxyCODONE-acetaminophen (PERCOCET/ROXICET) 5-325 MG per tablet Take 1-2 tablets by mouth every 4 (four) hours as needed for severe pain. 03/13/14   Elpidio AnisShari Upstill, PA-C  traMADol (ULTRAM) 50 MG tablet Take 1 tablet (50 mg total) by mouth every 6 (six) hours as needed. 10/27/15   Marlon Peliffany Greene, PA-C   Triage Vitals: BP 136/87 mmHg  Pulse 83  Temp(Src) 98.9 F (37.2 C) (Oral)  Resp 22  SpO2 99%  Physical Exam  Constitutional: He is oriented to person, place, and  time. He appears well-developed and well-nourished. No distress.  Nontoxic/nonseptic appearing  HENT:  Head: Normocephalic and atraumatic.  Right Ear: There is tenderness. No mastoid tenderness. Tympanic membrane is injected and erythematous.  Left Ear: Tympanic membrane, external ear and ear canal normal.  Mouth/Throat: Oropharynx is clear and moist.  R ear canal is swollen and erythematous. There is TTP when palpating the tragus and pulling on the R  auricle. No swelling or erythema of the R mastoid process. No trismus. R tympanic membrane is also erythematous and dull. No evidence of TM perforation. No swelling to the outer ear.  Eyes: Conjunctivae and EOM are normal. No scleral icterus.  Neck: Normal range of motion.  No nuchal rigidity or meningismus.  Pulmonary/Chest: Effort normal. No respiratory distress.  Respirations even and unlabored  Musculoskeletal: Normal range of motion.  Neurological: He is alert and oriented to person, place, and time. He exhibits normal muscle tone. Coordination normal.  Skin: Skin is warm and dry. No rash noted. He is not diaphoretic. No erythema. No pallor.  Psychiatric: He has a normal mood and affect. His behavior is normal.  Nursing note and vitals reviewed.   ED Course  Procedures (including critical care time) DIAGNOSTIC STUDIES: Oxygen Saturation is 99% on RA, normal by my interpretation.   COORDINATION OF CARE: 8:48 PM- Will order Amoxicillin and Ciprodex drops for first dose to be given prior to discharge and pt can take them home. Will also order pain medication prior to discharge and apply ear wick. Pt verbalizes understanding and agrees to plan.  Medications  oxyCODONE-acetaminophen (PERCOCET/ROXICET) 5-325 MG per tablet 2 tablet (not administered)  amoxicillin-clavulanate (AUGMENTIN) 875-125 MG per tablet 1 tablet (not administered)  ibuprofen (ADVIL,MOTRIN) tablet 400 mg (400 mg Oral Given 10/29/15 1931)    MDM   Final diagnoses:  Otitis externa, right  Acute right otitis media, recurrence not specified, unspecified otitis media type    34 year old male presented to the emergency department for persistent right ear pain. Patient diagnosed with otitis externa on 10/27/2015. He reports that he was unable to fill his prescriptions secondary to lack of insurance and cost of antibiotics. Physical exam today is consistent with otitis externa with suspect early otitis media, likely from  tracking of infection. No concern for mastoiditis. No nuchal rigidity or meningismus. Also doubt malignant otitis externa.  Ciprodex obtained in the emergency department and given to the patient for continued use. Following ear wick placement, an initial 4 drops of Ciprodex were placed in the right ear prior to discharge. Patient has been instructed to return in 3 days for ear wick removal. Will also place on amoxicillin course for suspected otitis media. Return precautions discussed and provided. Patient agreeable to plan with no unaddressed concerns. Patient discharged in good condition.  I personally performed the services described in this documentation, which was scribed in my presence. The recorded information has been reviewed and is accurate.    Filed Vitals:   10/29/15 1927 10/29/15 2104  BP: 136/87 129/88  Pulse: 83 72  Temp: 98.9 F (37.2 C) 98.9 F (37.2 C)  TempSrc: Oral Oral  Resp: 22 20  SpO2: 99% 100%       Antony Madura, PA-C 10/29/15 2152  Derwood Kaplan, MD 10/30/15 0008

## 2015-10-29 NOTE — ED Notes (Signed)
Ear wick given to PA

## 2015-10-29 NOTE — ED Notes (Signed)
Pt from home c/o right ear pain. He reports that he can't hear out of his right ear. He was seen at [facility] on 11/8 and was prescribed ear drops and pain medication. He reports that he is unable to get the prescriptions filled because they are too expensive.

## 2015-10-29 NOTE — Discharge Instructions (Signed)
Use ear drops for your infection; put 4 drops in your right ear every 12 hours for the next 7 days. Your next dose is due at 9:30AM on 10/30/15. Also take Amoxicillin as prescribed. [facility]; it may be free at [employer]. Follow up in 3 days for ear wick removal. Return sooner if symptoms worsen such as if you develop persistent fever, jaw weakness, or problems swallowing.  Otitis Externa Otitis externa is a bacterial or fungal infection of the outer ear canal. This is the area from the eardrum to the outside of the ear. Otitis externa is sometimes called "swimmer's ear." CAUSES  Possible causes of infection include:  Swimming in dirty water.  Moisture remaining in the ear after swimming or bathing.  Mild injury (trauma) to the ear.  Objects stuck in the ear (foreign body).  Cuts or scrapes (abrasions) on the outside of the ear. SIGNS AND SYMPTOMS  The first symptom of infection is often itching in the ear canal. Later signs and symptoms may include swelling and redness of the ear canal, ear pain, and yellowish-white fluid (pus) coming from the ear. The ear pain may be worse when pulling on the earlobe. DIAGNOSIS  Your health care provider will perform a physical exam. A sample of fluid may be taken from the ear and examined for bacteria or fungi. TREATMENT  Antibiotic ear drops are often given for 10 to 14 days. Treatment may also include pain medicine or corticosteroids to reduce itching and swelling. HOME CARE INSTRUCTIONS   Apply antibiotic ear drops to the ear canal as prescribed by your health care provider.  Take medicines only as directed by your health care provider.  If you have diabetes, follow any additional treatment instructions from your health care provider.  Keep all follow-up visits as directed by your health care provider. PREVENTION   Keep your ear dry. Use the corner of a towel to absorb water out of the ear canal after  swimming or bathing.  Avoid scratching or putting objects inside your ear. This can damage the ear canal or remove the protective wax that lines the canal. This makes it easier for bacteria and fungi to grow.  Avoid swimming in lakes, polluted water, or poorly chlorinated pools.  You may use ear drops made of rubbing alcohol and vinegar after swimming. Combine equal parts of white vinegar and alcohol in a bottle. Put 3 or 4 drops into each ear after swimming. SEEK MEDICAL CARE IF:   You have a fever.  Your ear is still red, swollen, painful, or draining pus after 3 days.  Your redness, swelling, or pain gets worse.  You have a severe headache.  You have redness, swelling, pain, or tenderness in the area behind your ear. MAKE SURE YOU:   Understand these instructions.  Will watch your condition.  Will get help right away if you are not doing well or get worse.   This information is not intended to replace advice given to you by your health care provider. Make sure you discuss any questions you have with your health care provider.   Document Released: 12/05/2005 Document Revised: 12/26/2014 Document Reviewed: 12/22/2011 Elsevier Interactive Patient Education [employer]2016 Elsevier Inc.

## 2016-04-06 ENCOUNTER — Encounter (HOSPITAL_COMMUNITY): Payer: Self-pay | Admitting: Emergency Medicine

## 2016-04-06 DIAGNOSIS — S93402A Sprain of unspecified ligament of left ankle, initial encounter: Secondary | ICD-10-CM | POA: Insufficient documentation

## 2016-04-06 DIAGNOSIS — Z791 Long term (current) use of non-steroidal anti-inflammatories (NSAID): Secondary | ICD-10-CM | POA: Insufficient documentation

## 2016-04-06 DIAGNOSIS — F17210 Nicotine dependence, cigarettes, uncomplicated: Secondary | ICD-10-CM | POA: Insufficient documentation

## 2016-04-06 DIAGNOSIS — Y9389 Activity, other specified: Secondary | ICD-10-CM | POA: Insufficient documentation

## 2016-04-06 DIAGNOSIS — Y998 Other external cause status: Secondary | ICD-10-CM | POA: Insufficient documentation

## 2016-04-06 DIAGNOSIS — Y9289 Other specified places as the place of occurrence of the external cause: Secondary | ICD-10-CM | POA: Insufficient documentation

## 2016-04-06 DIAGNOSIS — Z792 Long term (current) use of antibiotics: Secondary | ICD-10-CM | POA: Insufficient documentation

## 2016-04-06 DIAGNOSIS — W228XXA Striking against or struck by other objects, initial encounter: Secondary | ICD-10-CM | POA: Insufficient documentation

## 2016-04-06 DIAGNOSIS — J45909 Unspecified asthma, uncomplicated: Secondary | ICD-10-CM | POA: Insufficient documentation
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
## 2016-04-07 ENCOUNTER — Emergency Department (HOSPITAL_COMMUNITY): Payer: MEDICAID

## 2016-04-07 ENCOUNTER — Emergency Department (HOSPITAL_COMMUNITY)
Admission: EM | Admit: 2016-04-07 | Discharge: 2016-04-07 | Disposition: A | Discharge status: Home / Self Care | Payer: MEDICAID | Attending: Emergency Medicine | Admitting: Emergency Medicine

## 2016-04-07 DIAGNOSIS — S93402A Sprain of unspecified ligament of left ankle, initial encounter: Secondary | ICD-10-CM
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
## 2016-04-07 NOTE — Discharge Instructions (Signed)
Please read and follow all provided instructions.  Your diagnoses today include:  1. Ankle sprain, left, initial encounter    Tests performed today include:  Vital signs. See below for your results today.   Medications prescribed:   None  You can use Ibuprofen 400mg  combined with Tylenol 1000mg  for pain relief every 6 hours. Do not exceed 4g of Tylenol in one 24 hour period.  Home care instructions:  Follow any educational materials contained in this packet.  *PRICE in the first 24-48 hours after injury: Protect (with brace, splint, sling), if given by your provider Rest Ice- Do not apply ice pack directly to your skin, place towel or similar between your skin and ice/ice pack. Apply ice for 20 min, then remove for 40 min while awake Compression- Wear brace, elastic bandage, splint as directed by your provider Elevate affected extremity above the level of your heart when not walking around for the first 24-48 hours   Follow-up instructions: Please follow-up with your primary care provider in the next 48 hours for further evaluation of symptoms and treatment   Return instructions:   Please return to the Emergency Department if you do not get better, if you get worse, or new symptoms OR  - Fever (temperature greater than 101.25F)  - Bleeding that does not stop with holding pressure to the area    -Severe pain (please note that you may be more sore the day after your accident)  - Chest Pain  - Difficulty breathing  - Severe nausea or vomiting  - Inability to tolerate food and liquids  - Passing out  - Skin becoming red around your wounds  - Change in mental status (confusion or lethargy)  - New numbness or weakness     Please return if you have any other emergent concerns.  Additional Information:  Your vital signs today were: BP 113/92 mmHg   Pulse 95   Temp(Src) 98.2 F (36.8 C) (Oral)   Resp 18   Ht 6' (1.829 m)   Wt 85.73 kg   BMI 25.63 kg/m2   SpO2 97% If your blood  pressure (BP) was elevated above 135/85 this visit, please have this repeated by your doctor within one month. ---------------

## 2016-04-14 ENCOUNTER — Encounter (HOSPITAL_COMMUNITY): Payer: Self-pay | Admitting: Emergency Medicine

## 2016-04-14 DIAGNOSIS — W231XXA Caught, crushed, jammed, or pinched between stationary objects, initial encounter: Secondary | ICD-10-CM | POA: Insufficient documentation

## 2016-04-14 DIAGNOSIS — Z792 Long term (current) use of antibiotics: Secondary | ICD-10-CM | POA: Insufficient documentation

## 2016-04-14 DIAGNOSIS — S93402A Sprain of unspecified ligament of left ankle, initial encounter: Secondary | ICD-10-CM | POA: Insufficient documentation

## 2016-04-14 DIAGNOSIS — J45909 Unspecified asthma, uncomplicated: Secondary | ICD-10-CM | POA: Insufficient documentation

## 2016-04-14 DIAGNOSIS — F17210 Nicotine dependence, cigarettes, uncomplicated: Secondary | ICD-10-CM | POA: Insufficient documentation

## 2016-04-14 DIAGNOSIS — Y998 Other external cause status: Secondary | ICD-10-CM | POA: Insufficient documentation

## 2016-04-14 DIAGNOSIS — Y9389 Activity, other specified: Secondary | ICD-10-CM | POA: Insufficient documentation

## 2016-04-14 DIAGNOSIS — Y92512 Supermarket, store or market as the place of occurrence of the external cause: Secondary | ICD-10-CM | POA: Insufficient documentation

## 2016-04-14 DIAGNOSIS — Z79899 Other long term (current) drug therapy: Secondary | ICD-10-CM | POA: Insufficient documentation

## 2016-04-14 DIAGNOSIS — Z791 Long term (current) use of non-steroidal anti-inflammatories (NSAID): Secondary | ICD-10-CM | POA: Insufficient documentation

## 2016-04-14 MED ORDER — OXYCODONE-ACETAMINOPHEN 5-325 MG PO TABS
ORAL_TABLET | ORAL | Status: DC
Start: 2016-04-14 — End: 2016-04-15
  Filled 2016-04-14: qty 1

## 2016-04-14 MED ORDER — OXYCODONE-ACETAMINOPHEN 5-325 MG PO TABS
1.0000 | ORAL_TABLET | Freq: Once | ORAL | Status: AC
  Administered 2016-04-14: 1 via ORAL

## 2016-04-14 NOTE — ED Notes (Signed)
Pt. reports worsening left ankle pain onset last week unrelieved by OTC pain medication , seen here diagnosed with sprained ankle .

## 2016-04-15 ENCOUNTER — Emergency Department (HOSPITAL_COMMUNITY)
Admission: EM | Admit: 2016-04-15 | Discharge: 2016-04-15 | Disposition: A | Discharge status: Home / Self Care | Payer: Self-pay | Attending: Emergency Medicine | Admitting: Emergency Medicine

## 2016-04-15 ENCOUNTER — Emergency Department (HOSPITAL_COMMUNITY): Payer: Self-pay

## 2016-04-15 DIAGNOSIS — S93402A Sprain of unspecified ligament of left ankle, initial encounter: Secondary | ICD-10-CM

## 2016-04-15 MED ORDER — HYDROCODONE-ACETAMINOPHEN 5-325 MG PO TABS: 1.0000 | ORAL_TABLET | Freq: Four times a day (QID) | ORAL | Status: DC | PRN

## 2016-04-15 MED ORDER — IBUPROFEN 400 MG PO TABS
600.0000 mg | ORAL_TABLET | Freq: Once | ORAL | Status: AC
  Administered 2016-04-15: 600 mg via ORAL
  Filled 2016-04-15: qty 1

## 2016-04-15 MED ORDER — IBUPROFEN 800 MG PO TABS: 800.0000 mg | ORAL_TABLET | Freq: Three times a day (TID) | ORAL | Status: DC | PRN

## 2016-04-15 NOTE — Discharge Instructions (Signed)
Return here as needed.  Ice and elevate your knee and ankle.  Follow-up with the orthopedic doctor
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
## 2016-04-26 ENCOUNTER — Telehealth (HOSPITAL_BASED_OUTPATIENT_CLINIC_OR_DEPARTMENT_OTHER): Payer: Self-pay | Admitting: Emergency Medicine

## 2016-04-26 ENCOUNTER — Ambulatory Visit (HOSPITAL_COMMUNITY)
Admission: EM | Admit: 2016-04-26 | Discharge: 2016-04-26 | Disposition: A | Discharge status: Home / Self Care | Payer: MEDICAID | Attending: Emergency Medicine | Admitting: Emergency Medicine

## 2016-04-26 ENCOUNTER — Encounter (HOSPITAL_COMMUNITY): Payer: Self-pay | Admitting: Emergency Medicine

## 2016-04-26 DIAGNOSIS — S93402D Sprain of unspecified ligament of left ankle, subsequent encounter: Secondary | ICD-10-CM

## 2016-04-26 DIAGNOSIS — Z09 Encounter for follow-up examination after completed treatment for conditions other than malignant neoplasm: Secondary | ICD-10-CM

## 2016-04-26 NOTE — ED Notes (Signed)
Patient presents for follow-up of ankle sprain. Patient reports he needs clearance to go back to work. He reports that the pain is manageable when he is wearing the ankle brace and supportive sneakers. Patient is in NAD.

## 2016-04-26 NOTE — ED Provider Notes (Signed)
CSN: [id number]     Arrival date & time 04/26/16  1329 History   First MD Initiated Contact with Patient 04/26/16 1428     Chief Complaint  Patient presents with  . Follow-up   (Consider location/radiation/quality/duration/timing/severity/associated sxs/prior Treatment) HPI  He is a 35 year old man here for follow-up of left ankle sprain. He is requesting a return to work note. He sprained the left ankle approximately 3 weeks ago. He has been seen in the emergency room for this several times. He states over the last week the ankle has improved significantly. He states as long as he wears high top sneakers or the ankle brace, he does very well. He denies any additional swelling. He does still have some pain when walking without the brace. He would like a return to work note.  Past Medical History  Diagnosis Date  . Asthma    Past Surgical History  Procedure Laterality Date  . Shoulder surgery Left 07/10/2012  . Shoulder arthroscopy     History reviewed. No pertinent family history. Social History  Substance Use Topics  . Smoking status: Current Every Day Smoker    Types: Cigarettes  . Smokeless tobacco: None  . Alcohol Use: No    Review of Systems As in history of present illness Allergies  Mushroom extract complex; Tomato; and Food  Home Medications   Prior to Admission medications   Medication Sig Start Date End Date Taking? Authorizing Provider  acetaminophen (TYLENOL) 500 MG tablet Take 1,000 mg by mouth every 6 (six) hours as needed.    Historical Provider, MD  HYDROcodone-acetaminophen (NORCO/VICODIN) 5-325 MG tablet Take 1 tablet by mouth every 6 (six) hours as needed for moderate pain. 04/15/16   Charlestine Nighthristopher Lawyer, PA-C  ibuprofen (ADVIL,MOTRIN) 800 MG tablet Take 1 tablet (800 mg total) by mouth every 8 (eight) hours as needed. 04/15/16   Charlestine Nighthristopher Lawyer, PA-C   Meds Ordered and Administered this Visit  Medications - No data to display  BP 128/64 mmHg  Pulse 77   Temp(Src) 98.3 F (36.8 C) (Oral)  Resp 16  SpO2 99% No data found.   Physical Exam  Constitutional: He is oriented to person, place, and time. He appears well-developed and well-nourished. No distress.  Cardiovascular: Normal rate.   Pulmonary/Chest: Effort normal.  Musculoskeletal:  Left ankle: No erythema or edema. He does have some tenderness distal to the lateral malleolus. Minimal pain with passive inversion. No joint laxity.  Neurological: He is alert and oriented to person, place, and time.    ED Course  Procedures (including critical care time)  Labs Review Labs Reviewed - No data to display  Imaging Review No results found.    MDM   1. Follow-up exam   2. Left ankle sprain, subsequent encounter    Return to work note provided. Recommended continued use of high top sneakers or brace as needed. Discussed that he should expect some swelling at the end of the day. Rehabilitation exercises given. Follow-up as needed.    Charm RingsErin J Elmore Hyslop, MD 04/26/16 [phone]

## 2016-04-26 NOTE — Discharge Instructions (Signed)
It looks like your ankle is well on the way to healing. Do the strengthening exercises I showed you. You can return to work at this time. You should expect some swelling at the end of the day for the next several days. Follow-up as needed.

## 2016-11-06 IMAGING — DX DG TIBIA/FIBULA 2V*L*
4 series · 4 of 4 positions shown · non-contrast
Comparison: None.

CLINICAL DATA: Left lower leg injury and pain today. Initial
encounter.

EXAM:
LEFT TIBIA AND FIBULA - 2 VIEW

[tibia ap (1 of 2)]
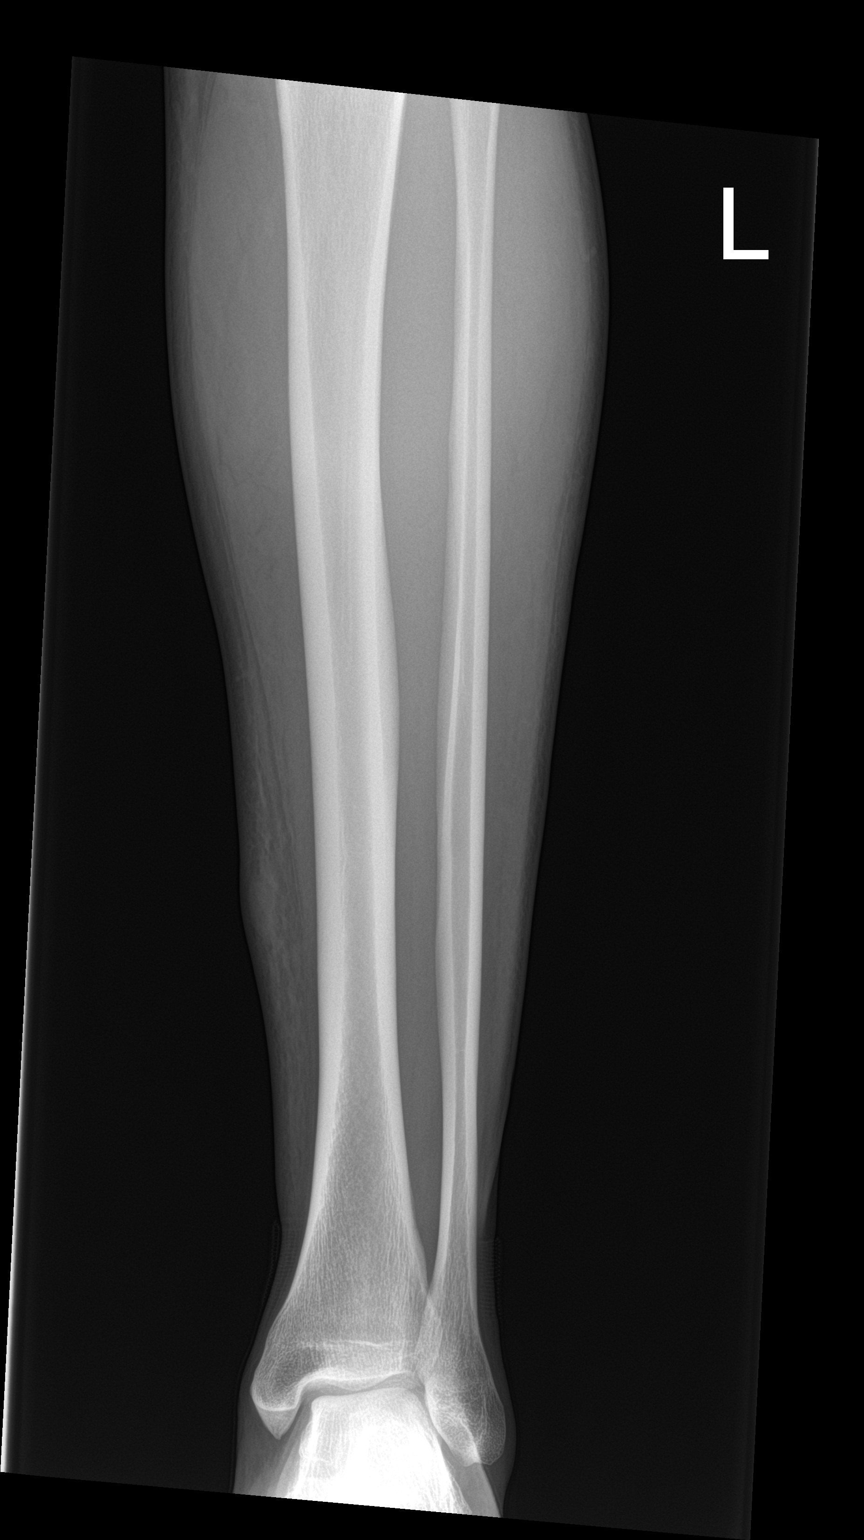

[tibia ap (2 of 2)]
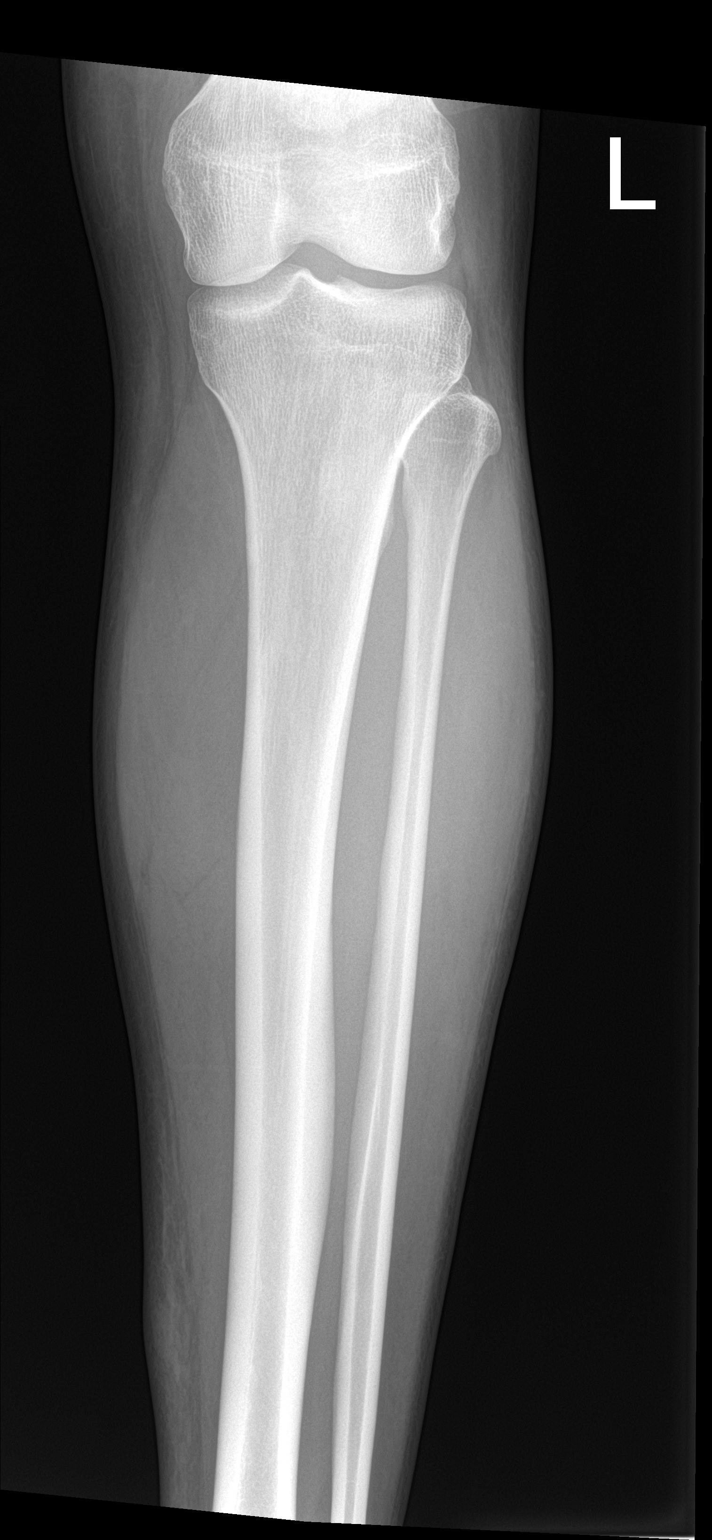

[tibia lat (1 of 2)]
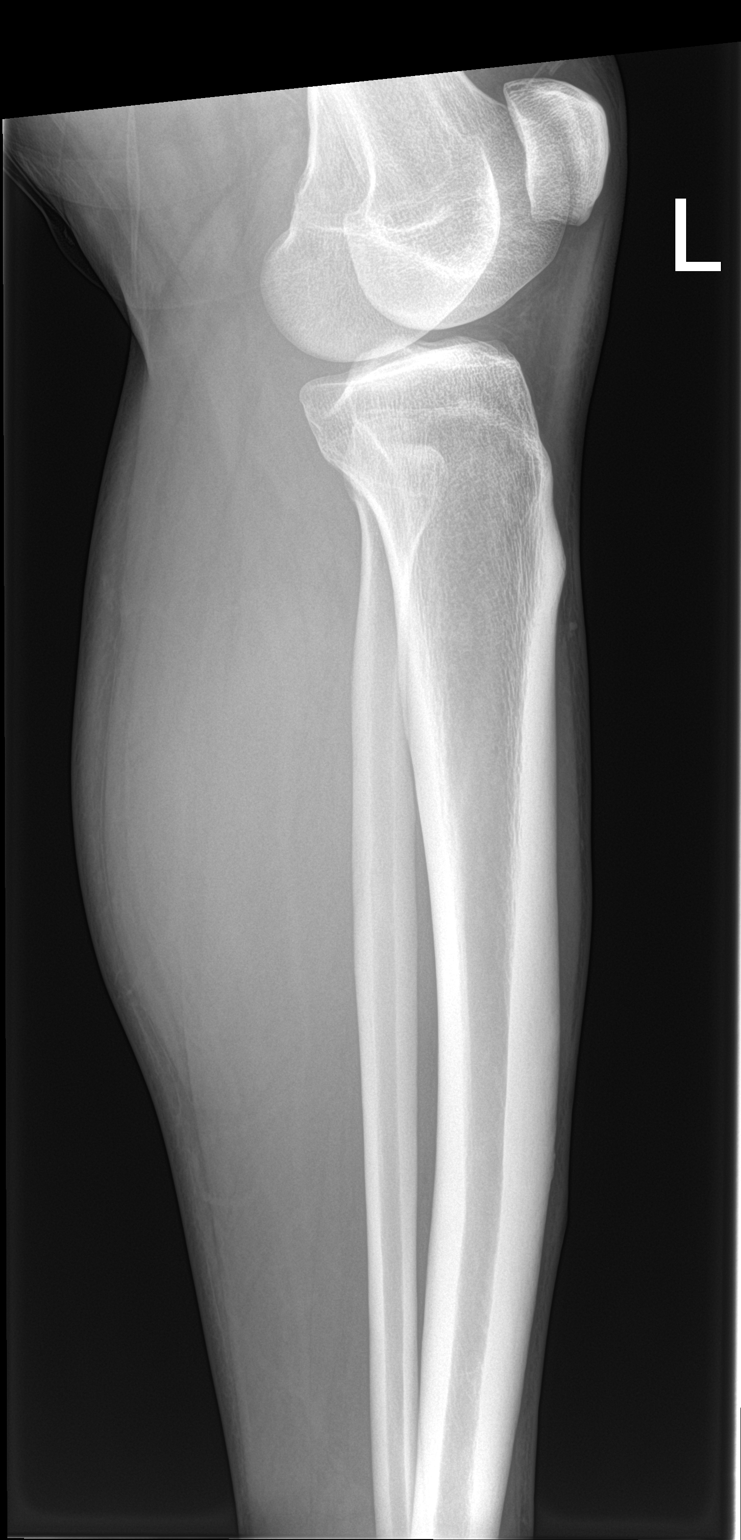

[tibia lat (2 of 2)]
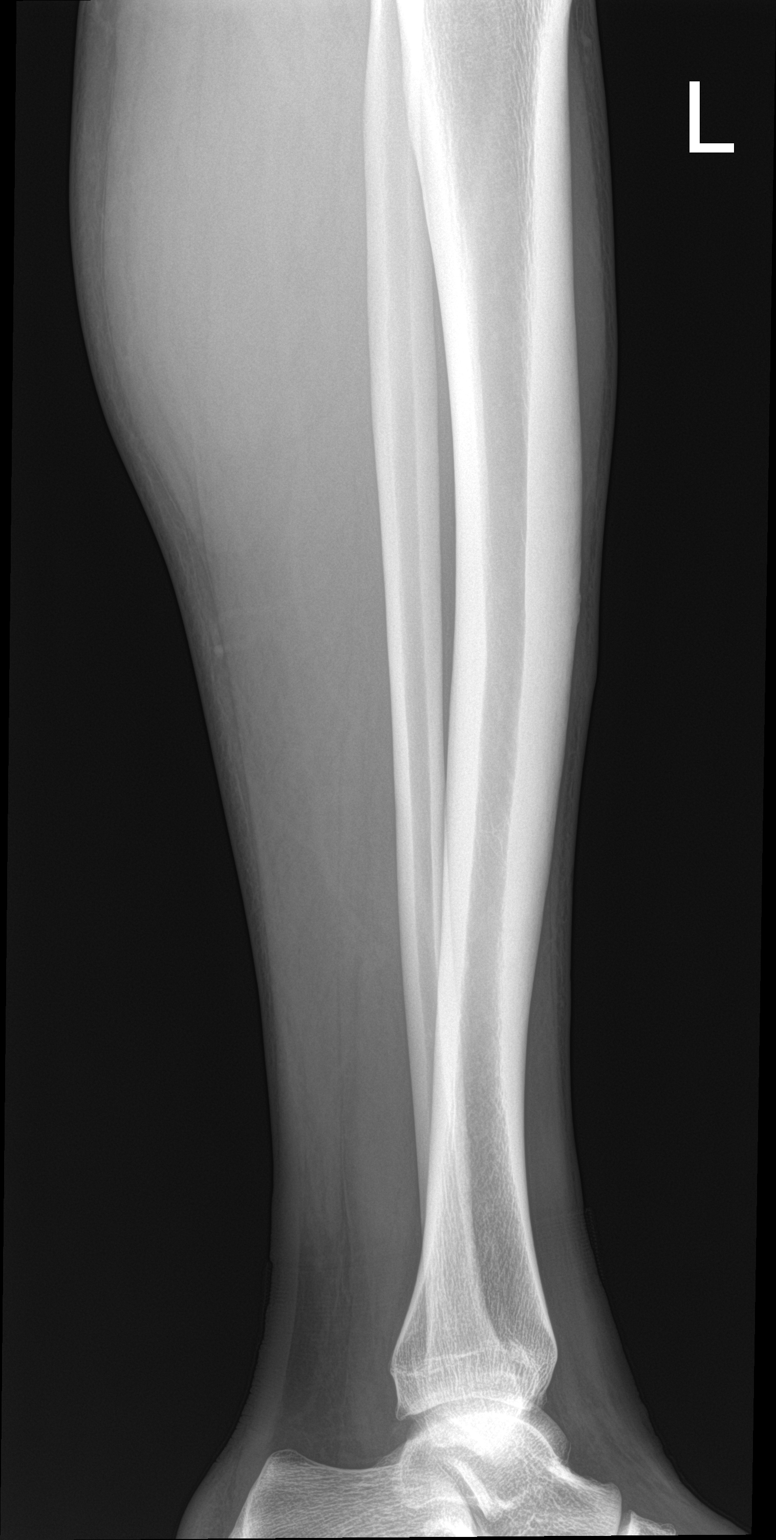

[4 of 4 positions shown; findings below may reference images not displayed]

FINDINGS: There is no evidence of fracture, subluxation or dislocation.

No focal bony lesions are identified.

Medial soft tissue swelling is noted.

No radiopaque foreign bodies are noted.
IMPRESSION: Soft tissue swelling without bony abnormality.

## 2016-12-21 ENCOUNTER — Emergency Department (HOSPITAL_COMMUNITY)
Admission: EM | Admit: 2016-12-21 | Discharge: 2016-12-21 | Disposition: A | Discharge status: Home / Self Care | Payer: Self-pay | Attending: Dermatology | Admitting: Dermatology

## 2016-12-21 ENCOUNTER — Encounter (HOSPITAL_COMMUNITY): Payer: Self-pay | Admitting: Emergency Medicine

## 2016-12-21 DIAGNOSIS — Z5321 Procedure and treatment not carried out due to patient leaving prior to being seen by health care provider: Secondary | ICD-10-CM | POA: Insufficient documentation

## 2016-12-21 DIAGNOSIS — F17210 Nicotine dependence, cigarettes, uncomplicated: Secondary | ICD-10-CM | POA: Insufficient documentation

## 2016-12-21 DIAGNOSIS — J45909 Unspecified asthma, uncomplicated: Secondary | ICD-10-CM | POA: Insufficient documentation

## 2016-12-21 DIAGNOSIS — L0231 Cutaneous abscess of buttock: Secondary | ICD-10-CM | POA: Insufficient documentation

## 2016-12-21 NOTE — ED Triage Notes (Signed)
Pt. reports skin abscess at left inner buttocks onset 3 days ago with no drainage .

## 2017-04-09 ENCOUNTER — Emergency Department (HOSPITAL_COMMUNITY): Payer: No Typology Code available for payment source

## 2017-04-09 ENCOUNTER — Encounter (HOSPITAL_COMMUNITY): Payer: Self-pay

## 2017-04-09 ENCOUNTER — Emergency Department (HOSPITAL_COMMUNITY)
Admission: EM | Admit: 2017-04-09 | Discharge: 2017-04-09 | Disposition: A | Discharge status: Home / Self Care | Payer: No Typology Code available for payment source | Attending: Emergency Medicine | Admitting: Emergency Medicine

## 2017-04-09 DIAGNOSIS — S0091XA Abrasion of unspecified part of head, initial encounter: Secondary | ICD-10-CM | POA: Diagnosis not present

## 2017-04-09 DIAGNOSIS — Y999 Unspecified external cause status: Secondary | ICD-10-CM | POA: Diagnosis not present

## 2017-04-09 DIAGNOSIS — S5001XA Contusion of right elbow, initial encounter: Secondary | ICD-10-CM | POA: Diagnosis not present

## 2017-04-09 DIAGNOSIS — Y939 Activity, unspecified: Secondary | ICD-10-CM | POA: Diagnosis not present

## 2017-04-09 DIAGNOSIS — R1030 Lower abdominal pain, unspecified: Secondary | ICD-10-CM | POA: Diagnosis not present

## 2017-04-09 DIAGNOSIS — M25552 Pain in left hip: Secondary | ICD-10-CM | POA: Diagnosis not present

## 2017-04-09 DIAGNOSIS — T07XXXA Unspecified multiple injuries, initial encounter: Secondary | ICD-10-CM

## 2017-04-09 DIAGNOSIS — Y92410 Unspecified street and highway as the place of occurrence of the external cause: Secondary | ICD-10-CM | POA: Diagnosis not present

## 2017-04-09 DIAGNOSIS — S59901A Unspecified injury of right elbow, initial encounter: Secondary | ICD-10-CM | POA: Diagnosis present

## 2017-04-09 DIAGNOSIS — S0990XA Unspecified injury of head, initial encounter: Secondary | ICD-10-CM | POA: Insufficient documentation

## 2017-04-09 DIAGNOSIS — M25561 Pain in right knee: Secondary | ICD-10-CM | POA: Insufficient documentation

## 2017-04-09 LAB — I-STAT CHEM 8, ED
BUN: 10 mg/dL (ref 6–20)
CREATININE: 1.1 mg/dL (ref 0.61–1.24)
Calcium, Ion: 1.06 mmol/L — ABNORMAL LOW (ref 1.15–1.40)
Chloride: 107 mmol/L (ref 101–111)
GLUCOSE: 109 mg/dL — AB (ref 65–99)
HCT: 41 % (ref 39.0–52.0)
Hemoglobin: 13.9 g/dL (ref 13.0–17.0)
POTASSIUM: 3.5 mmol/L (ref 3.5–5.1)
Sodium: 142 mmol/L (ref 135–145)
TCO2: 24 mmol/L (ref 0–100)

## 2017-04-09 MED ORDER — IOPAMIDOL (ISOVUE-300) INJECTION 61%
INTRAVENOUS | Status: AC
Start: 2017-04-09 — End: 2017-04-09
  Administered 2017-04-09: 100 mL
  Filled 2017-04-09: qty 100

## 2017-04-09 MED ORDER — ONDANSETRON HCL 4 MG/2ML IJ SOLN
4.0000 mg | Freq: Once | INTRAMUSCULAR | Status: AC
  Administered 2017-04-09: 4 mg via INTRAVENOUS
  Filled 2017-04-09: qty 2

## 2017-04-09 MED ORDER — MORPHINE SULFATE (PF) 4 MG/ML IV SOLN
4.0000 mg | Freq: Once | INTRAVENOUS | Status: AC
  Administered 2017-04-09: 4 mg via INTRAVENOUS
  Filled 2017-04-09: qty 1

## 2017-04-09 MED ORDER — IBUPROFEN 800 MG PO TABS: 800.0000 mg | ORAL_TABLET | Freq: Three times a day (TID) | ORAL | 0 refills | Status: DC | PRN

## 2017-04-09 MED ORDER — HYDROCODONE-ACETAMINOPHEN 5-325 MG PO TABS: 2.0000 | ORAL_TABLET | Freq: Four times a day (QID) | ORAL | 0 refills | Status: DC | PRN
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
## 2017-04-09 NOTE — ED Notes (Signed)
Nurse currently starting IV and drawing labs. 

## 2017-04-09 NOTE — Discharge Instructions (Signed)
To find a primary care or specialty doctor please call [phone] or [phone] to access "[facility] Find a Doctor Service." ° °You may also go on the [facility] website at [email or web address] ° °There are also multiple [facility], [facility], [facility] and [facility] practices throughout the [place] that are frequently accepting new patients. You may find a clinic that is close to your home and contact them. ° °[facility] -  °[address] °[place] [address] °[phone] ° ° °[facility] -  °[address] °[place] [address] °[phone] ° ° °[facility] - °[address]  °[place] [address] °[phone] ° ° °

## 2017-04-09 NOTE — ED Notes (Signed)
Patient transported to X-ray 
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
## 2018-02-14 ENCOUNTER — Other Ambulatory Visit: Payer: Self-pay

## 2018-02-14 ENCOUNTER — Emergency Department (HOSPITAL_COMMUNITY)
Admission: EM | Admit: 2018-02-14 | Discharge: 2018-02-14 | Disposition: A | Discharge status: Home / Self Care | Payer: Self-pay | Attending: Emergency Medicine | Admitting: Emergency Medicine

## 2018-02-14 ENCOUNTER — Encounter (HOSPITAL_COMMUNITY): Payer: Self-pay | Admitting: Emergency Medicine

## 2018-02-14 ENCOUNTER — Emergency Department (HOSPITAL_COMMUNITY): Payer: Self-pay

## 2018-02-14 DIAGNOSIS — R0602 Shortness of breath: Secondary | ICD-10-CM | POA: Insufficient documentation

## 2018-02-14 DIAGNOSIS — J181 Lobar pneumonia, unspecified organism: Secondary | ICD-10-CM | POA: Insufficient documentation

## 2018-02-14 DIAGNOSIS — J189 Pneumonia, unspecified organism: Secondary | ICD-10-CM

## 2018-02-14 DIAGNOSIS — R059 Cough, unspecified: Secondary | ICD-10-CM

## 2018-02-14 DIAGNOSIS — F17210 Nicotine dependence, cigarettes, uncomplicated: Secondary | ICD-10-CM | POA: Insufficient documentation

## 2018-02-14 DIAGNOSIS — R05 Cough: Secondary | ICD-10-CM

## 2018-02-14 DIAGNOSIS — J45909 Unspecified asthma, uncomplicated: Secondary | ICD-10-CM | POA: Insufficient documentation

## 2018-02-14 LAB — CBC
HCT: 45.1 % (ref 39.0–52.0)
HEMOGLOBIN: 15.7 g/dL (ref 13.0–17.0)
MCH: 31.2 pg (ref 26.0–34.0)
MCHC: 34.8 g/dL (ref 30.0–36.0)
MCV: 89.7 fL (ref 78.0–100.0)
PLATELETS: 275 10*3/uL (ref 150–400)
RBC: 5.03 MIL/uL (ref 4.22–5.81)
RDW: 13.5 % (ref 11.5–15.5)
WBC: 14.5 10*3/uL — AB (ref 4.0–10.5)

## 2018-02-14 LAB — BASIC METABOLIC PANEL
ANION GAP: 11 (ref 5–15)
BUN: 9 mg/dL (ref 6–20)
CALCIUM: 9.5 mg/dL (ref 8.9–10.3)
CO2: 24 mmol/L (ref 22–32)
CREATININE: 1.11 mg/dL (ref 0.61–1.24)
Chloride: 102 mmol/L (ref 101–111)
Glucose, Bld: 104 mg/dL — ABNORMAL HIGH (ref 65–99)
Potassium: 4.3 mmol/L (ref 3.5–5.1)
SODIUM: 137 mmol/L (ref 135–145)

## 2018-02-14 LAB — I-STAT TROPONIN, ED: TROPONIN I, POC: 0 ng/mL (ref 0.00–0.08)

## 2018-02-14 MED ORDER — BENZONATATE 100 MG PO CAPS
100.0000 mg | ORAL_CAPSULE | Freq: Once | ORAL | Status: AC
  Administered 2018-02-14: 100 mg via ORAL
  Filled 2018-02-14: qty 1

## 2018-02-14 MED ORDER — BENZONATATE 100 MG PO CAPS: 100.0000 mg | ORAL_CAPSULE | Freq: Three times a day (TID) | ORAL | 0 refills | Status: DC

## 2018-02-14 MED ORDER — PREDNISONE 20 MG PO TABS
60.0000 mg | ORAL_TABLET | Freq: Once | ORAL | Status: AC
  Administered 2018-02-14: 60 mg via ORAL
  Filled 2018-02-14: qty 3

## 2018-02-14 MED ORDER — ALBUTEROL SULFATE (2.5 MG/3ML) 0.083% IN NEBU
5.0000 mg | INHALATION_SOLUTION | Freq: Once | RESPIRATORY_TRACT | Status: AC
  Administered 2018-02-14: 5 mg via RESPIRATORY_TRACT
  Filled 2018-02-14: qty 6

## 2018-02-14 MED ORDER — DOXYCYCLINE HYCLATE 100 MG PO TABS
100.0000 mg | ORAL_TABLET | Freq: Once | ORAL | Status: AC
  Administered 2018-02-14: 100 mg via ORAL
  Filled 2018-02-14: qty 1

## 2018-02-14 MED ORDER — DOXYCYCLINE HYCLATE 100 MG PO CAPS: 100.0000 mg | ORAL_CAPSULE | Freq: Two times a day (BID) | ORAL | 0 refills | Status: DC

## 2018-02-14 MED ORDER — ALBUTEROL SULFATE HFA 108 (90 BASE) MCG/ACT IN AERS
2.0000 | INHALATION_SPRAY | Freq: Once | RESPIRATORY_TRACT | Status: AC
  Administered 2018-02-14: 2 via RESPIRATORY_TRACT
  Filled 2018-02-14: qty 6.7

## 2018-02-14 MED ORDER — PREDNISONE 20 MG PO TABS: ORAL_TABLET | ORAL | 0 refills | Status: DC

## 2018-02-14 NOTE — ED Notes (Signed)
Pt. Transported to xray 

## 2018-02-14 NOTE — ED Notes (Signed)
VSS. Pt ambulatory to lobby with steady gait. Pt verbalized understanding of d/c instructions, prescriptions, and f/u info.

## 2018-02-14 NOTE — Discharge Instructions (Signed)
Take the prescribed medication as directed. Rest, hydrate, etc at home to help recovery. Return to the ED for new or worsening symptoms.

## 2018-02-14 NOTE — ED Triage Notes (Signed)
Patient with shortness of breath and chest pain since last Thursday.  Patient states he has been coughing with deep breath and having pain in his back with the cough.  Patient sounds congested.
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
## 2018-04-16 ENCOUNTER — Encounter (HOSPITAL_COMMUNITY): Payer: Self-pay | Admitting: *Deleted

## 2018-04-16 ENCOUNTER — Ambulatory Visit (HOSPITAL_COMMUNITY)
Admission: EM | Admit: 2018-04-16 | Discharge: 2018-04-16 | Disposition: A | Discharge status: Home / Self Care | Payer: Self-pay | Attending: Internal Medicine | Admitting: Internal Medicine

## 2018-04-16 DIAGNOSIS — M79601 Pain in right arm: Secondary | ICD-10-CM

## 2018-04-16 MED ORDER — IBUPROFEN 800 MG PO TABS: 800.0000 mg | ORAL_TABLET | Freq: Three times a day (TID) | ORAL | 0 refills | Status: DC
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
## 2018-04-16 NOTE — Discharge Instructions (Addendum)
Please wear Ace wrap over the next 2 to 3 days as needed for forearm swelling.  Use thumb spica splint as needed for thumb pain.  Ice forearm 20 minutes every hour.  You may take ibuprofen 800 mg every 6-8 hours along with Tylenol 1000 mg every 6 hours.  If no improvement in 5 to 7 days return to the clinic.

## 2018-07-05 ENCOUNTER — Encounter (HOSPITAL_COMMUNITY): Payer: Self-pay | Admitting: Emergency Medicine

## 2018-07-05 ENCOUNTER — Ambulatory Visit (HOSPITAL_COMMUNITY)
Admission: EM | Admit: 2018-07-05 | Discharge: 2018-07-05 | Disposition: A | Discharge status: Home / Self Care | Payer: Self-pay | Attending: Family Medicine | Admitting: Family Medicine

## 2018-07-05 DIAGNOSIS — S025XXB Fracture of tooth (traumatic), initial encounter for open fracture: Secondary | ICD-10-CM

## 2018-07-05 DIAGNOSIS — K0889 Other specified disorders of teeth and supporting structures: Secondary | ICD-10-CM

## 2018-07-05 MED ORDER — HYDROCODONE-ACETAMINOPHEN 7.5-325 MG PO TABS: 1.0000 | ORAL_TABLET | Freq: Four times a day (QID) | ORAL | 0 refills | Status: DC | PRN

## 2018-07-05 MED ORDER — IBUPROFEN 800 MG PO TABS: 800.0000 mg | ORAL_TABLET | Freq: Three times a day (TID) | ORAL | 0 refills | Status: DC

## 2018-07-05 NOTE — ED Triage Notes (Signed)
Pt sts left sided dental pain x 2 days

## 2018-07-05 NOTE — Discharge Instructions (Signed)
Call a dentist as soon as possible Take ibuprofen 3 times a day with food.  This is for moderate pain Take hydrocodone as needed for severe pain.  Cautioned drowsiness.  Caution constipation. Do not drive or operate machinery if you are on the hydrocodone. Work note is given

## 2018-07-05 NOTE — ED Provider Notes (Signed)
[facility]    CSN: [id number] Arrival date & time: 07/05/18  1425     History   Chief Complaint Chief Complaint  Patient presents with  . Dental Pain    HPI Travis Soto is a 37 y.o. male.   HPI  Patient is here for dental pain.  He states he broke a tooth a couple days ago.  He states that he has called the dentist but cannot get in until Monday.  He is hoping to be seen Friday.  He states that the tooth is becoming increasingly painful.  It has sharp edges that are rubbing on his tongue.  He is having difficulty speaking.  No gum swelling or redness, no fever.  He thinks the pain is because the tooth is broken "to the root".  He does not get regular dental care.  He is otherwise healthy.  Past Medical History:  Diagnosis Date  . Asthma     There are no active problems to display for this patient.   Past Surgical History:  Procedure Laterality Date  . SHOULDER ARTHROSCOPY    . SHOULDER SURGERY Left 07/10/2012       Home Medications    Prior to Admission medications   Medication Sig Start Date End Date Taking? Authorizing Provider  albuterol (PROVENTIL HFA;VENTOLIN HFA) 108 (90 Base) MCG/ACT inhaler Inhale into the lungs every 6 (six) hours as needed for wheezing or shortness of breath.    [provider]  HYDROcodone-acetaminophen (NORCO) 7.5-325 MG tablet Take 1 tablet by mouth every 6 (six) hours as needed for severe pain. 07/05/18   Eustace Moore, MD  ibuprofen (ADVIL,MOTRIN) 800 MG tablet Take 1 tablet (800 mg total) by mouth 3 (three) times daily. 07/05/18   Eustace Moore, MD    Family History Family History  Problem Relation Age of Onset  . Hypertension Mother   . Hypertension Father     Social History Social History   Tobacco Use  . Smoking status: Current Every Day Smoker    Packs/day: 1.00    Types: Cigarettes  . Smokeless tobacco: Never Used  Substance Use Topics  . Alcohol use: Yes  . Drug use: Yes    Comment:  occ     Allergies   Mushroom extract complex; Tomato; and Food   Review of Systems Review of Systems  Constitutional: Negative for chills and fever.  HENT: Positive for dental problem. Negative for ear pain and sore throat.   Eyes: Negative for pain and visual disturbance.  Respiratory: Negative for cough and shortness of breath.   Cardiovascular: Negative for chest pain and palpitations.  Gastrointestinal: Negative for abdominal pain and vomiting.  Genitourinary: Negative for dysuria and hematuria.  Musculoskeletal: Negative for arthralgias and back pain.  Skin: Negative for color change and rash.  Neurological: Negative for seizures and syncope.  All other systems reviewed and are negative.    Physical Exam Triage Vital Signs ED Triage Vitals  Enc Vitals Group     BP 07/05/18 1455 130/79     Pulse Rate 07/05/18 1455 75     Resp 07/05/18 1455 18     Temp 07/05/18 1455 98.7 F (37.1 C)     Temp Source 07/05/18 1455 Oral     SpO2 07/05/18 1455 100 %     Weight --      Height --      Head Circumference --      Peak Flow --  Pain Score 07/05/18 1532 0     Pain Loc --      Pain Edu? --      Excl. in GC? --    No data found.  Updated Vital Signs BP 130/79 (BP Location: Left Arm)   Pulse 75   Temp 98.7 F (37.1 C) (Oral)   Resp 18   SpO2 100%   Visual Acuity Right Eye Distance:   Left Eye Distance:   Bilateral Distance:    Right Eye Near:   Left Eye Near:    Bilateral Near:     Physical Exam  Constitutional: He appears well-developed and well-nourished. No distress.  HENT:  Head: Normocephalic and atraumatic.  Right Ear: External ear normal.  Left Ear: External ear normal.  Mouth/Throat: Oropharynx is clear and moist.    Eyes: Pupils are equal, round, and reactive to light. Conjunctivae are normal.  Neck: Normal range of motion.  Cardiovascular: Normal rate.  Pulmonary/Chest: Effort normal. No respiratory distress.  Abdominal: Soft. He  exhibits no distension.  Musculoskeletal: Normal range of motion. He exhibits no edema.  Neurological: He is alert.  Skin: Skin is warm and dry.     UC Treatments / Results  Labs (all labs ordered are listed, but only abnormal results are displayed) Labs Reviewed - No data to display  EKG None  Radiology No results found.  Procedures Procedures (including critical care time)  Medications Ordered in UC Medications - No data to display  Initial Impression / Assessment and Plan / UC Course  I have reviewed the triage vital signs and the nursing notes.  Pertinent labs & imaging results that were available during my care of the patient were reviewed by me and considered in my medical decision making (see chart for details).     Discussed over-the-counter paste and cement to cover the tooth until he can get in with a dentist. Final Clinical Impressions(s) / UC Diagnoses   Final diagnoses:  Pain, dental  Open fracture of tooth, initial encounter     Discharge Instructions     Call a dentist as soon as possible Take ibuprofen 3 times a day with food.  This is for moderate pain Take hydrocodone as needed for severe pain.  Cautioned drowsiness.  Caution constipation. Do not drive or operate machinery if you are on the hydrocodone. Work note is given   ED Prescriptions    Medication Sig Dispense Auth. Provider   ibuprofen (ADVIL,MOTRIN) 800 MG tablet Take 1 tablet (800 mg total) by mouth 3 (three) times daily. 90 tablet Eustace MooreNelson, Dashanique Brownstein Sue, MD   HYDROcodone-acetaminophen [facility](NORCO) 7.5-325 MG tablet Take 1 tablet by mouth every 6 (six) hours as needed for severe pain. 15 tablet Eustace MooreNelson, [place] Rowe Sue, MD     Controlled Substance Prescriptions [place] Controlled Substance Registry consulted? Not Applicable   Eustace MooreNelson, Ambers Iyengar Sue, MD 07/05/18 2125

## 2018-07-25 ENCOUNTER — Encounter (HOSPITAL_COMMUNITY): Payer: Self-pay

## 2018-07-25 ENCOUNTER — Ambulatory Visit (HOSPITAL_COMMUNITY)
Admission: EM | Admit: 2018-07-25 | Discharge: 2018-07-25 | Disposition: A | Discharge status: Home / Self Care | Payer: Managed Care, Other (non HMO) | Attending: Family Medicine | Admitting: Family Medicine

## 2018-07-25 DIAGNOSIS — K529 Noninfective gastroenteritis and colitis, unspecified: Secondary | ICD-10-CM

## 2018-07-25 MED ORDER — ONDANSETRON HCL 4 MG PO TABS: 4.0000 mg | ORAL_TABLET | Freq: Four times a day (QID) | ORAL | 0 refills | Status: AC

## 2018-07-25 NOTE — ED Triage Notes (Signed)
Pt presents with complaints of abdominal pain and emesis x 2 days. Pt states pain is aggravated with movement. States he ate [place] food the other day.

## 2018-07-25 NOTE — Discharge Instructions (Signed)
It was nice meeting you!!  I believe that this illness is related to the food you ate.  If you do not get better or see any improvement in the next few days please return for follow up. If you develop any worsening symptoms please go to the ER.

## 2018-07-25 NOTE — ED Provider Notes (Signed)
[facility]    CSN: [id number] Arrival date & time: 07/25/18  1191     History   Chief Complaint Chief Complaint  Patient presents with  . Abdominal Pain    HPI Travis Soto is a 37 y.o. male.   Patient is a 37 year old male with abdominal pain, nausea, vomiting that started Monday evening after eating some [place] food.  He reports that he has vomited approximately 6 times and the last episode was bile color.  He denies any diarrhea,fevers, chills.  The pain is mostly located in the left lower quadrant radiating into the left flank area.  It is been constant and worse with bending or moving.  Symptoms to include dysuria, hematuria, frequency.  Last bowel movement was last night and normal.  He denies any blood in his stool, recent sick contacts or recent traveling.  ROS per HPI      Past Medical History:  Diagnosis Date  . Asthma     There are no active problems to display for this patient.   Past Surgical History:  Procedure Laterality Date  . SHOULDER ARTHROSCOPY    . SHOULDER SURGERY Left 07/10/2012       Home Medications    Prior to Admission medications   Medication Sig Start Date End Date Taking? Authorizing Provider  albuterol (PROVENTIL HFA;VENTOLIN HFA) 108 (90 Base) MCG/ACT inhaler Inhale into the lungs every 6 (six) hours as needed for wheezing or shortness of breath.    [provider]  HYDROcodone-acetaminophen (NORCO) 7.5-325 MG tablet Take 1 tablet by mouth every 6 (six) hours as needed for severe pain. 07/05/18   Eustace Moore, MD  ibuprofen (ADVIL,MOTRIN) 800 MG tablet Take 1 tablet (800 mg total) by mouth 3 (three) times daily. 07/05/18   Eustace Moore, MD    Family History Family History  Problem Relation Age of Onset  . Hypertension Mother   . Hypertension Father     Social History Social History   Tobacco Use  . Smoking status: Current Every Day Smoker    Packs/day: 1.00    Types: Cigarettes  .  Smokeless tobacco: Never Used  Substance Use Topics  . Alcohol use: Yes  . Drug use: Yes    Comment: occ     Allergies   Mushroom extract complex; Tomato; and Food   Review of Systems Review of Systems   Physical Exam Triage Vital Signs ED Triage Vitals [07/25/18 1026]  Enc Vitals Group     BP 127/82     Pulse Rate 69     Resp 18     Temp 98.7 F (37.1 C)     Temp src      SpO2 94 %     Weight      Height      Head Circumference      Peak Flow      Pain Score 7     Pain Loc      Pain Edu?      Excl. in GC?    No data found.  Updated Vital Signs BP 127/82   Pulse 69   Temp 98.7 F (37.1 C)   Resp 18   SpO2 94%   Visual Acuity Right Eye Distance:   Left Eye Distance:   Bilateral Distance:    Right Eye Near:   Left Eye Near:    Bilateral Near:     Physical Exam  Constitutional: He appears well-developed and well-nourished.  Non-toxic appearance.  He appears ill. No distress.  HENT:  Head: Normocephalic and atraumatic.  Cardiovascular: Normal rate and normal heart sounds.  Pulmonary/Chest: Effort normal and breath sounds normal.  Abdominal: Soft. Normal appearance and normal aorta. He exhibits no shifting dullness, no distension, no fluid wave and no mass. Bowel sounds are increased. There is tenderness. There is no guarding and no CVA tenderness.  Tenderness to palpation of the left lower quadrant and left flank.  Negative rebound.   Neurological: He is alert.  Skin: Skin is warm and dry. Capillary refill takes less than 2 seconds.  Psychiatric: He has a normal mood and affect.  Nursing note and vitals reviewed.    UC Treatments / Results  Labs (all labs ordered are listed, but only abnormal results are displayed) Labs Reviewed - No data to display  EKG None  Radiology No results found.  Procedures Procedures (including critical care time)  Medications Ordered in UC Medications - No data to display  Initial Impression / Assessment and  Plan / UC Course  I have reviewed the triage vital signs and the nursing notes.  Pertinent labs & imaging results that were available during my care of the patient were reviewed by me and considered in my medical decision making (see chart for details).     Most likely gastroenteritis.  Other differentials include diverticulitis.  Patient denied any history of diverticulitis.  Being that this started after eating [place] food which he reports he rarely eats most likely related to that.  He is feeling somewhat better today and drinking fluids.  We will discharge with work note and strict return precautions if the pain does not get any better.  She agreed Final Clinical Impressions(s) / UC Diagnoses   Final diagnoses:  None   Discharge Instructions   None    ED Prescriptions    None     Controlled Substance Prescriptions [place] Controlled Substance Registry consulted? Not Applicable   Janace ArisBast, Joycelynn Fritsche A, NP 07/25/18 1138

## 2018-08-23 ENCOUNTER — Ambulatory Visit (HOSPITAL_COMMUNITY)
Admission: EM | Admit: 2018-08-23 | Discharge: 2018-08-23 | Disposition: A | Discharge status: Home / Self Care | Payer: Managed Care, Other (non HMO) | Attending: Family Medicine | Admitting: Family Medicine

## 2018-08-23 ENCOUNTER — Ambulatory Visit (INDEPENDENT_AMBULATORY_CARE_PROVIDER_SITE_OTHER): Payer: Managed Care, Other (non HMO)

## 2018-08-23 ENCOUNTER — Encounter (HOSPITAL_COMMUNITY): Payer: Self-pay | Admitting: Emergency Medicine

## 2018-08-23 DIAGNOSIS — M79672 Pain in left foot: Secondary | ICD-10-CM

## 2018-08-23 MED ORDER — DICLOFENAC SODIUM 75 MG PO TBEC: 75.0000 mg | DELAYED_RELEASE_TABLET | Freq: Two times a day (BID) | ORAL | 0 refills | Status: AC

## 2018-08-23 NOTE — ED Provider Notes (Signed)
[facility]   [id number] 08/23/18 Arrival Time: 1214  ASSESSMENT & PLAN:  1. Foot pain, left     Imaging: Dg Foot Complete Left  Result Date: 08/23/2018 CLINICAL DATA:  Status post fall with left foot pain. EXAM: LEFT FOOT - COMPLETE 3+ VIEW COMPARISON:  None. FINDINGS: There is no evidence of fracture or dislocation. There is no evidence of arthropathy or other focal bone abnormality. Soft tissues are unremarkable. IMPRESSION: Negative. Electronically Signed   By: Sherian Rein M.D.   On: 08/23/2018 13:56   Possibly gout. Discussed.  Meds ordered this encounter  Medications  . diclofenac (VOLTAREN) 75 MG EC tablet    Sig: Take 1 tablet (75 mg total) by mouth 2 (two) times daily.    Dispense:  14 tablet    Refill:  0    Follow-up Information    [facility].   Specialty:  Urgent Care Why:  As needed or if you are not seeing improvement over the next several days. Contact information: [address] [place] [address] [phone]         Weight bearing as tolerated.  Reviewed expectations re: course of current medical issues. Questions answered. Outlined signs and symptoms indicating need for more acute intervention. Patient verbalized understanding. After Visit Summary given.  SUBJECTIVE: History from: patient. Travis Soto is a 37 y.o. male who reports intermittent moderate pain of his left foot that has gradually worsened since beginning; described as aching without radiation. Questions gout. Onset: gradual, about 3 days ago. Injury/trama: no, but prolonged walking at work Relieved by: not bearing weight. Worsened by: ambulation; certain movements. Associated symptoms: none reported. Extremity sensation changes or weakness: none. Self treatment: tried OTCs without relief of pain; prn use History of similar: no No new medications. No h/o gout.  ROS: As per HPI.   OBJECTIVE:  Vitals:   08/23/18 1248 08/23/18 1249  BP: 127/79   Pulse: 72   Resp: 16   Temp: 98.2 F (36.8 C)   TempSrc: Oral   SpO2: 97%   Weight:  83.9 kg    General appearance: alert; no distress Extremities: warm and well perfused; symmetrical with no gross deformities; poorly localized tenderness over his left dorsal foot with mild swelling and no bruising; ROM around area or areas of discomfort: normal CV: brisk extremity capillary refill Skin: warm and dry Neurologic: normal gait; normal symmetric reflexes in all extremities; normal sensation in all extremities Psychological: alert and cooperative; normal mood and affect  Allergies  Allergen Reactions  . Mushroom Extract Complex Anaphylaxis and Hives  . Tomato Anaphylaxis  . Food Swelling    Red grapes    Past Medical History:  Diagnosis Date  . Asthma    Social History   Socioeconomic History  . Marital status: Single    Spouse name: Not on file  . Number of children: Not on file  . Years of education: Not on file  . Highest education level: Not on file  Occupational History  . Not on file  Social Needs  . Financial resource strain: Not on file  . Food insecurity:    Worry: Not on file    Inability: Not on file  . Transportation needs:    Medical: Not on file    Non-medical: Not on file  Tobacco Use  . Smoking status: Current Every Day Smoker    Packs/day: 1.00    Types: Cigarettes  . Smokeless tobacco: Never  Used  Substance and Sexual Activity  . Alcohol use: Yes  . Drug use: Yes    Comment: occ  . Sexual activity: Not on file  Lifestyle  . Physical activity:    Days per week: Not on file    Minutes per session: Not on file  . Stress: Not on file  Relationships  . Social connections:    Talks on phone: Not on file    Gets together: Not on file    Attends religious service: Not on file    Active member of club or organization: Not on file    Attends meetings of clubs or organizations: Not on file    Relationship  status: Not on file  Other Topics Concern  . Not on file  Social History Narrative  . Not on file   Family History  Problem Relation Age of Onset  . Hypertension Mother   . Hypertension Father    Past Surgical History:  Procedure Laterality Date  . SHOULDER ARTHROSCOPY    . SHOULDER SURGERY Left 07/10/2012      Mardella Layman, MD 08/28/18 [phone]

## 2018-08-23 NOTE — ED Triage Notes (Signed)
PT reports severe pain over left side of left foot. No injury. Swollen area. Pain for 3 days.

## 2018-08-27 ENCOUNTER — Ambulatory Visit (HOSPITAL_COMMUNITY)
Admission: EM | Admit: 2018-08-27 | Discharge: 2018-08-27 | Disposition: A | Discharge status: Home / Self Care | Payer: Managed Care, Other (non HMO) | Attending: Urgent Care | Admitting: Urgent Care

## 2018-08-27 ENCOUNTER — Encounter (HOSPITAL_COMMUNITY): Payer: Self-pay | Admitting: Emergency Medicine

## 2018-08-27 DIAGNOSIS — M79672 Pain in left foot: Secondary | ICD-10-CM | POA: Diagnosis not present

## 2018-08-27 DIAGNOSIS — Z79899 Other long term (current) drug therapy: Secondary | ICD-10-CM | POA: Insufficient documentation

## 2018-08-27 DIAGNOSIS — M7989 Other specified soft tissue disorders: Secondary | ICD-10-CM | POA: Diagnosis not present

## 2018-08-27 LAB — URIC ACID: URIC ACID, SERUM: 5.1 mg/dL (ref 3.7–8.6)

## 2018-08-27 MED ORDER — PREDNISONE 20 MG PO TABS: ORAL_TABLET | ORAL | 0 refills | Status: AC

## 2018-08-27 NOTE — ED Notes (Signed)
Bed: UC01 Expected date:  Expected time:  Means of arrival:  Comments: Appointment 

## 2018-08-27 NOTE — ED Provider Notes (Signed)
  MRN: [id number] DOB: [birth year]  Subjective:   Mekiah Cranmer is a 37 y.o. male presenting for persistent left foot pain.  Last office visit was 08/23/2018 at this practice, was started on Voltaren.  Patient reports that he is used this with good temporary relief but pain returns once the medicine wears off.  He has rested his foot and use crutches to ambulate.  He did not go to work over the weekend and try to return today but was unable to tolerate a full shift.  Denies fever, redness, trauma, falls, history of gout.  Patient tries to work shoes with good support and adequate space with an issue but felt that it was very painful to try and put her on today.  No current facility-administered medications for this encounter.   Current Outpatient Medications:  .  albuterol (PROVENTIL HFA;VENTOLIN HFA) 108 (90 Base) MCG/ACT inhaler, Inhale into the lungs every 6 (six) hours as needed for wheezing or shortness of breath., Disp: , Rfl:  .  diclofenac (VOLTAREN) 75 MG EC tablet, Take 1 tablet (75 mg total) by mouth 2 (two) times daily., Disp: 14 tablet, Rfl: 0 .  ondansetron (ZOFRAN) 4 MG tablet, Take 1 tablet (4 mg total) by mouth every 6 (six) hours., Disp: 12 tablet, Rfl: 0    Allergies  Allergen Reactions  . Mushroom Extract Complex Anaphylaxis and Hives  . Tomato Anaphylaxis  . Food Swelling    Red grapes    Past Medical History:  Diagnosis Date  . Asthma      Past Surgical History:  Procedure Laterality Date  . SHOULDER ARTHROSCOPY    . SHOULDER SURGERY Left 07/10/2012    Objective:   Vitals: BP 126/82   Pulse 66   Temp 98.5 F (36.9 C)   Resp 18   SpO2 100%   Physical Exam  Constitutional: He is oriented to person, place, and time. He appears well-developed and well-nourished.  Cardiovascular: Normal rate.  Pulmonary/Chest: Effort normal.  Musculoskeletal:       Left foot: There is tenderness (generalized tenderness over dorsal aspect of foot, heel and ball of foot)  and swelling (trace). There is normal range of motion, no bony tenderness, normal capillary refill, no crepitus, no deformity and no laceration.  Neurological: He is alert and oriented to person, place, and time.   Assessment and Plan :   Foot pain, left  Swelling of left foot  Will use prednisone for its anti-inflammatory properties given that Voltaren is given patient a good temporary response.  Counseled that he needs to rest off of his foot and provided patient with a work note for 2 additional days off work.  Uric acid level is pending.  X-ray from 08/23/2018 was completely normal.  Return to clinic precautions reviewed.  Patient dropped into the work queue for PCP assistance, can also follow-up with occupational health for further management and FMLA if necessary.   Wallis Bamberg, PA-C 08/27/18 1444

## 2018-08-27 NOTE — Discharge Instructions (Signed)
Do not take any other NSAID including diclofenac, ibuprofen, naproxen, Aleve, Motrin, etc while taking prednisone.   °

## 2018-08-27 NOTE — ED Triage Notes (Signed)
Pt c/o L sided foot pain, no injury, pain x1 week. Was seen here last Thursday for same.

## 2018-12-05 ENCOUNTER — Emergency Department (HOSPITAL_COMMUNITY)
Admission: EM | Admit: 2018-12-05 | Discharge: 2018-12-06 | Disposition: A | Discharge status: Home / Self Care | Payer: Managed Care, Other (non HMO) | Attending: Emergency Medicine | Admitting: Emergency Medicine

## 2018-12-05 ENCOUNTER — Encounter (HOSPITAL_COMMUNITY): Payer: Self-pay | Admitting: Emergency Medicine

## 2018-12-05 DIAGNOSIS — F17210 Nicotine dependence, cigarettes, uncomplicated: Secondary | ICD-10-CM | POA: Insufficient documentation

## 2018-12-05 DIAGNOSIS — H60501 Unspecified acute noninfective otitis externa, right ear: Secondary | ICD-10-CM | POA: Insufficient documentation

## 2018-12-05 DIAGNOSIS — F1290 Cannabis use, unspecified, uncomplicated: Secondary | ICD-10-CM | POA: Insufficient documentation

## 2018-12-05 NOTE — ED Triage Notes (Signed)
Patient reports persistent right ear ache for several weeks , denies injury /no drainage or bleeding , denies fever or chills .

## 2018-12-06 MED ORDER — AMOXICILLIN 500 MG PO CAPS
1000.0000 mg | ORAL_CAPSULE | Freq: Once | ORAL | Status: AC
  Administered 2018-12-06: 1000 mg via ORAL
  Filled 2018-12-06: qty 2

## 2018-12-06 MED ORDER — AMOXICILLIN 500 MG PO CAPS: 1000.0000 mg | ORAL_CAPSULE | Freq: Three times a day (TID) | ORAL | 0 refills | Status: AC

## 2018-12-06 MED ORDER — NEOMYCIN-POLYMYXIN-HC 3.5-10000-1 OT SUSP: 4.0000 [drp] | Freq: Three times a day (TID) | OTIC | 0 refills | Status: AC

## 2018-12-06 MED ORDER — IBUPROFEN 600 MG PO TABS: 600.0000 mg | ORAL_TABLET | Freq: Four times a day (QID) | ORAL | 0 refills | Status: AC | PRN

## 2018-12-06 NOTE — Discharge Instructions (Addendum)
Take the antibiotics as prescribed and use the drops as prescribed. Followup with the ENT doctor. Return to the ED if you develop new or worsening symptoms.

## 2018-12-06 NOTE — ED Provider Notes (Signed)
[facility]   CSN: [id number] Arrival date & time: 12/05/18  2344     History   Chief Complaint Chief Complaint  Patient presents with  . Otalgia    HPI Genevie AnnDarrell Cominsky is a 37 y.o. male.  Patient presents with 3 weeks history of right ear pain.  Denies trauma.  Reports pain is constant and worse with lying on that side.  He reports gradual onset right-sided headache over the past 4 days that comes and goes which he believes is coming from the ear.  He has be taking Tylenol without relief.  No focal weakness, numbness or tingling.  No difficulty speaking or difficulty swallowing.  No bleeding or drainage.  Denies using any Q-tips or any trauma to the ear.  No difficulty breathing or difficulty swallowing.  No chest pain or shortness of breath.  He is not a diabetic.  The history is provided by the patient.  Otalgia  Associated symptoms include hearing loss. Pertinent negatives include no ear discharge, no headaches, no abdominal pain, no vomiting and no cough.    Past Medical History:  Diagnosis Date  . Asthma     There are no active problems to display for this patient.   Past Surgical History:  Procedure Laterality Date  . SHOULDER ARTHROSCOPY    . SHOULDER SURGERY Left 07/10/2012        Home Medications    Prior to Admission medications   Medication Sig Start Date End Date Taking? Authorizing Provider  albuterol (PROVENTIL HFA;VENTOLIN HFA) 108 (90 Base) MCG/ACT inhaler Inhale into the lungs every 6 (six) hours as needed for wheezing or shortness of breath.    [provider]  amoxicillin (AMOXIL) 500 MG capsule Take 2 capsules (1,000 mg total) by mouth 3 (three) times daily. 12/06/18   Keron Neenan, Jeannett SeniorStephen, MD  diclofenac (VOLTAREN) 75 MG EC tablet Take 1 tablet (75 mg total) by mouth 2 (two) times daily. 08/23/18   Mardella LaymanHagler, Brian, MD  neomycin-polymyxin-hydrocortisone (CORTISPORIN) 3.5-10000-1 OTIC suspension  Place 4 drops into the right ear 3 (three) times daily for 7 days. 12/06/18 12/13/18  Fares Ramthun, Jeannett SeniorStephen, MD  ondansetron (ZOFRAN) 4 MG tablet Take 1 tablet (4 mg total) by mouth every 6 (six) hours. 07/25/18   Janace ArisBast, Traci A, NP  predniSONE (DELTASONE) 20 MG tablet Take 2 tablets daily with breakfast. 08/27/18   Wallis BambergMani, Mario, PA-C    Family History Family History  Problem Relation Age of Onset  . Hypertension Mother   . Hypertension Father     Social History Social History   Tobacco Use  . Smoking status: Current Every Day Smoker    Packs/day: 1.00    Types: Cigarettes  . Smokeless tobacco: Never Used  Substance Use Topics  . Alcohol use: Yes  . Drug use: Yes    Types: Marijuana    Comment: occ     Allergies   Mushroom extract complex; Tomato; and Food   Review of Systems Review of Systems  Constitutional: Negative for activity change and appetite change.  HENT: Positive for ear pain and hearing loss. Negative for congestion, ear discharge and facial swelling.   Respiratory: Negative for cough, chest tightness and shortness of breath.   Gastrointestinal: Negative for abdominal pain, nausea and vomiting.  Genitourinary: Negative for dysuria and hematuria.  Musculoskeletal: Negative for arthralgias and myalgias.  Neurological: Negative for dizziness, weakness and headaches.   all other systems are negative except as noted in the HPI  and PMH.     Physical Exam Updated Vital Signs BP 126/85 (BP Location: Left Arm)   Pulse 83   Temp 98 F (36.7 C) (Oral)   Resp 14   Ht 6' (1.829 m)   Wt 78.9 kg   SpO2 98%   BMI 23.60 kg/m   Physical Exam Vitals signs and nursing note reviewed.  Constitutional:      General: He is not in acute distress.    Appearance: He is well-developed.  HENT:     Head: Normocephalic and atraumatic.     Right Ear: Decreased hearing noted. A middle ear effusion is present. No mastoid tenderness. Tympanic membrane is injected, erythematous and  bulging. Tympanic membrane is not perforated.     Left Ear: Tympanic membrane and ear canal normal.     Ears:     Comments: No mastoid tenderness.  Tragus tenderness on the right.  Right external auditory canal is inflamed with injection of TM with middle ear effusion.    Nose: Nose normal.     Mouth/Throat:     Pharynx: No oropharyngeal exudate.     Comments: No asymmetry of the oropharynx.  Uvula midline Eyes:     Conjunctiva/sclera: Conjunctivae normal.     Pupils: Pupils are equal, round, and reactive to light.  Neck:     Musculoskeletal: Normal range of motion and neck supple.     Comments: No meningismus. Cardiovascular:     Rate and Rhythm: Normal rate and regular rhythm.     Heart sounds: Normal heart sounds. No murmur.  Pulmonary:     Effort: Pulmonary effort is normal. No respiratory distress.     Breath sounds: Normal breath sounds.  Abdominal:     Palpations: Abdomen is soft.     Tenderness: There is no abdominal tenderness. There is no guarding or rebound.  Musculoskeletal: Normal range of motion.        General: No tenderness.  Skin:    General: Skin is warm.  Neurological:     Mental Status: He is alert and oriented to person, place, and time.     Cranial Nerves: No cranial nerve deficit.     Motor: No abnormal muscle tone.     Coordination: Coordination normal.     Comments: No ataxia on finger to nose bilaterally. No pronator drift. 5/5 strength throughout. CN 2-12 intact.Equal grip strength. Sensation intact.   Psychiatric:        Behavior: Behavior normal.      ED Treatments / Results  Labs (all labs ordered are listed, but only abnormal results are displayed) Labs Reviewed - No data to display  EKG None  Radiology No results found.  Procedures Procedures (including critical care time)  Medications Ordered in ED Medications  amoxicillin (AMOXIL) capsule 1,000 mg (has no administration in time range)     Initial Impression / Assessment and  Plan / ED Course  I have reviewed the triage vital signs and the nursing notes.  Pertinent labs & imaging results that were available during my care of the patient were reviewed by me and considered in my medical decision making (see chart for details).    3 weeks of right ear pain with intermittent headache on the right side.  There is evidence of both otitis media and externa.  Will treat with topical and p.o. antibiotics.  There is no mastoid tenderness to suggest mastoiditis.  Patient appears well and nontoxic and is afebrile.  No history of diabetes.  Follow-up with ENT.  Patient to be treated for both otitis media and externa.  Return precautions discussed.  Final Clinical Impressions(s) / ED Diagnoses   Final diagnoses:  Acute otitis externa of right ear, unspecified type    ED Discharge Orders         Ordered    amoxicillin (AMOXIL) 500 MG capsule  3 times daily     12/06/18 0013    neomycin-polymyxin-hydrocortisone (CORTISPORIN) 3.5-10000-1 OTIC suspension  3 times daily     12/06/18 0013           Glynn Octaveancour, Anniston Nellums, MD 12/06/18 0202
